# Patient Record
Sex: Female | Born: 1990 | Race: Black or African American | Hispanic: No | Marital: Single
Health system: Midwestern US, Community
[De-identification: ages and names within clinical notes are randomized; demographics above are authoritative.]

## PROBLEM LIST (undated history)

## (undated) ENCOUNTER — Inpatient Hospital Stay (HOSPITAL_COMMUNITY): Payer: Self-pay

## (undated) DIAGNOSIS — O9989 Other specified diseases and conditions complicating pregnancy, childbirth and the puerperium: Secondary | ICD-10-CM

## (undated) DIAGNOSIS — F419 Anxiety disorder, unspecified: Secondary | ICD-10-CM

## (undated) DIAGNOSIS — B009 Herpesviral infection, unspecified: Secondary | ICD-10-CM

## (undated) DIAGNOSIS — M549 Dorsalgia, unspecified: Secondary | ICD-10-CM

## (undated) DIAGNOSIS — O99891 Other specified diseases and conditions complicating pregnancy: Secondary | ICD-10-CM

## (undated) DIAGNOSIS — Z01419 Encounter for gynecological examination (general) (routine) without abnormal findings: Secondary | ICD-10-CM

## (undated) HISTORY — PX: TUBAL LIGATION: SHX77

## (undated) MED ORDER — ONDANSETRON 4 MG TAB, RAPID DISSOLVE
4 mg | ORAL_TABLET | Freq: Three times a day (TID) | ORAL | Status: DC | PRN
Start: ? — End: 2017-02-17

---
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
## 2009-04-17 LAB — STD PANEL
HIV 1/2 Interpretation: NONREACTIVE
Hep B surface Ag Interp.: NEGATIVE
Hepatitis B surface Ag: 0.11 Index (ref <1.00)
RPR: NONREACTIVE

## 2009-04-18 LAB — CHLAMYDIA/GC PCR
Chlamydia amplified: POSITIVE — AB
N. gonorrhea, amplified: NEGATIVE

## 2009-08-11 ENCOUNTER — Other Ambulatory Visit: Admission: RE | Admit: 2009-08-11 | Discharge: 2009-08-11 | Payer: Self-pay | Admitting: Family Medicine

## 2010-07-17 ENCOUNTER — Emergency Department (HOSPITAL_COMMUNITY)
Admission: EM | Admit: 2010-07-17 | Discharge: 2010-07-17 | Payer: Self-pay | Source: Home / Self Care | Admitting: Emergency Medicine

## 2010-08-11 ENCOUNTER — Other Ambulatory Visit: Admission: RE | Admit: 2010-08-11 | Discharge: 2010-08-11 | Payer: Self-pay | Admitting: Family Medicine

## 2010-10-27 LAB — GC/CHLAMYDIA PROBE AMP, GENITAL
Chlamydia: NEGATIVE
Gonorrhea: NEGATIVE

## 2010-10-27 LAB — ABO/RH

## 2010-10-27 LAB — RPR: RPR: NONREACTIVE

## 2010-12-08 LAB — CBC
HCT: 37 % (ref 36.0–46.0)
Hemoglobin: 12.7 g/dL (ref 12.0–15.0)
MCH: 28.2 pg (ref 26.0–34.0)
MCV: 82 fL (ref 78.0–100.0)
RBC: 4.51 MIL/uL (ref 3.87–5.11)

## 2010-12-08 LAB — DIFFERENTIAL
Basophils Relative: 0 % (ref 0–1)
Monocytes Absolute: 1 10*3/uL (ref 0.1–1.0)
Monocytes Relative: 7 % (ref 3–12)
Neutro Abs: 12.5 10*3/uL — ABNORMAL HIGH (ref 1.7–7.7)

## 2010-12-08 LAB — BASIC METABOLIC PANEL
CO2: 22 mEq/L (ref 19–32)
Chloride: 107 mEq/L (ref 96–112)
GFR calc Af Amer: >60 mL/min (ref >60)
Potassium: 3.5 mEq/L (ref 3.5–5.1)
Sodium: 137 mEq/L (ref 135–145)

## 2010-12-08 LAB — URINALYSIS, ROUTINE W REFLEX MICROSCOPIC
Bilirubin Urine: NEGATIVE
Glucose, UA: NEGATIVE mg/dL
Protein, ur: 100 mg/dL — AB
Specific Gravity, Urine: 1.015 (ref 1.005–1.030)

## 2010-12-08 LAB — URINE MICROSCOPIC-ADD ON

## 2010-12-08 LAB — URINE CULTURE

## 2010-12-26 ENCOUNTER — Inpatient Hospital Stay (HOSPITAL_COMMUNITY): Admission: AD | Admit: 2010-12-26 | Payer: Self-pay | Source: Ambulatory Visit | Admitting: Obstetrics

## 2011-01-11 ENCOUNTER — Other Ambulatory Visit (HOSPITAL_COMMUNITY): Payer: Self-pay | Admitting: Obstetrics

## 2011-01-11 DIAGNOSIS — Z3689 Encounter for other specified antenatal screening: Secondary | ICD-10-CM

## 2011-01-17 ENCOUNTER — Ambulatory Visit (HOSPITAL_COMMUNITY)
Admission: RE | Admit: 2011-01-17 | Discharge: 2011-01-17 | Disposition: A | Discharge status: Home / Self Care | Payer: Medicaid Other | Source: Ambulatory Visit | Attending: Obstetrics | Admitting: Obstetrics

## 2011-01-17 ENCOUNTER — Encounter (HOSPITAL_COMMUNITY): Payer: Self-pay

## 2011-01-17 DIAGNOSIS — Z3689 Encounter for other specified antenatal screening: Secondary | ICD-10-CM

## 2011-01-19 ENCOUNTER — Ambulatory Visit (HOSPITAL_COMMUNITY): Payer: Medicaid Other

## 2011-03-18 ENCOUNTER — Emergency Department (HOSPITAL_COMMUNITY)
Admission: EM | Admit: 2011-03-18 | Discharge: 2011-03-18 | Disposition: A | Discharge status: Home / Self Care | Payer: 59 | Attending: Emergency Medicine | Admitting: Emergency Medicine

## 2011-03-18 DIAGNOSIS — O99891 Other specified diseases and conditions complicating pregnancy: Secondary | ICD-10-CM | POA: Insufficient documentation

## 2011-03-18 DIAGNOSIS — M79609 Pain in unspecified limb: Secondary | ICD-10-CM | POA: Insufficient documentation

## 2011-04-01 LAB — STREP B DNA PROBE: GBS: POSITIVE

## 2011-05-18 ENCOUNTER — Encounter (HOSPITAL_COMMUNITY): Payer: Self-pay | Admitting: *Deleted

## 2011-05-18 ENCOUNTER — Telehealth (HOSPITAL_COMMUNITY): Payer: Self-pay | Admitting: *Deleted

## 2011-05-18 NOTE — Telephone Encounter (Signed)
Preadmission screen  

## 2011-05-20 ENCOUNTER — Inpatient Hospital Stay (HOSPITAL_COMMUNITY)
Admission: RE | Admit: 2011-05-20 | Discharge: 2011-05-25 | DRG: 765 | Disposition: A | Discharge status: Home / Self Care | Payer: 59 | Source: Ambulatory Visit | Attending: Obstetrics | Admitting: Obstetrics

## 2011-05-20 DIAGNOSIS — Z22330 Carrier of Group B streptococcus: Secondary | ICD-10-CM

## 2011-05-20 DIAGNOSIS — O99892 Other specified diseases and conditions complicating childbirth: Secondary | ICD-10-CM | POA: Diagnosis present

## 2011-05-20 LAB — CBC
Hemoglobin: 11.8 g/dL — ABNORMAL LOW (ref 12.0–15.0)
RBC: 4.41 MIL/uL (ref 3.87–5.11)

## 2011-05-20 LAB — RPR: RPR Ser Ql: NONREACTIVE

## 2011-05-20 MED ORDER — FENTANYL 2.5 MCG/ML BUPIVACAINE 1/10 % EPIDURAL INFUSION (WH - ANES)
14.0000 mL/h | INTRAMUSCULAR | Status: DC
  Administered 2011-05-21 – 2011-05-22 (×7): 14 mL/h via EPIDURAL
  Filled 2011-05-20 (×6): qty 60

## 2011-05-20 MED ORDER — MISOPROSTOL 25 MCG QUARTER TABLET
25.0000 ug | ORAL_TABLET | Freq: Four times a day (QID) | ORAL | Status: DC
  Administered 2011-05-20: 25 ug via VAGINAL
  Filled 2011-05-20: qty 0.25
  Filled 2011-05-20: qty 1

## 2011-05-20 MED ORDER — PHENYLEPHRINE 40 MCG/ML (10ML) SYRINGE FOR IV PUSH (FOR BLOOD PRESSURE SUPPORT)
80.0000 ug | PREFILLED_SYRINGE | INTRAVENOUS | Status: DC | PRN
  Filled 2011-05-20: qty 5

## 2011-05-20 MED ORDER — PENICILLIN G POTASSIUM 5000000 UNITS IJ SOLR
2.5000 10*6.[IU] | INTRAVENOUS | Status: DC
  Administered 2011-05-20 – 2011-05-22 (×10): 2.5 10*6.[IU] via INTRAVENOUS
  Filled 2011-05-20 (×13): qty 2.5

## 2011-05-20 MED ORDER — OXYCODONE-ACETAMINOPHEN 5-325 MG PO TABS
2.0000 | ORAL_TABLET | ORAL | Status: DC | PRN
  Administered 2011-05-23: 2 via ORAL
  Filled 2011-05-20 (×3): qty 2
  Filled 2011-05-20: qty 1
  Filled 2011-05-20: qty 2

## 2011-05-20 MED ORDER — OXYTOCIN 20 UNITS IN LACTATED RINGERS INFUSION - SIMPLE: 125.0000 mL/h | INTRAVENOUS | Status: AC

## 2011-05-20 MED ORDER — EPHEDRINE 5 MG/ML INJ
10.0000 mg | INTRAVENOUS | Status: AC | PRN
  Administered 2011-05-22: 10 mg via INTRAVENOUS
  Administered 2011-05-22: 5 mg via INTRAVENOUS
  Filled 2011-05-20: qty 4

## 2011-05-20 MED ORDER — PHENYLEPHRINE 40 MCG/ML (10ML) SYRINGE FOR IV PUSH (FOR BLOOD PRESSURE SUPPORT)
80.0000 ug | PREFILLED_SYRINGE | INTRAVENOUS | Status: DC | PRN
  Filled 2011-05-20 (×2): qty 5

## 2011-05-20 MED ORDER — PENICILLIN G POTASSIUM 5000000 UNITS IJ SOLR
5.0000 10*6.[IU] | Freq: Once | INTRAMUSCULAR | Status: AC
  Administered 2011-05-20: 5 10*6.[IU] via INTRAVENOUS
  Filled 2011-05-20: qty 5

## 2011-05-20 MED ORDER — FLEET ENEMA 7-19 GM/118ML RE ENEM: 1.0000 | ENEMA | RECTAL | Status: DC | PRN

## 2011-05-20 MED ORDER — OXYTOCIN BOLUS FROM INFUSION
500.0000 mL | Freq: Once | INTRAVENOUS | Status: DC
  Filled 2011-05-20: qty 1000
  Filled 2011-05-20: qty 500

## 2011-05-20 MED ORDER — ONDANSETRON HCL 4 MG/2ML IJ SOLN: 4.0000 mg | Freq: Four times a day (QID) | INTRAMUSCULAR | Status: DC | PRN

## 2011-05-20 MED ORDER — IBUPROFEN 600 MG PO TABS
600.0000 mg | ORAL_TABLET | Freq: Four times a day (QID) | ORAL | Status: DC | PRN
  Administered 2011-05-23: 600 mg via ORAL
  Filled 2011-05-20 (×4): qty 1

## 2011-05-20 MED ORDER — OXYTOCIN 20 UNITS IN LACTATED RINGERS INFUSION - SIMPLE
1.0000 m[IU]/min | INTRAVENOUS | Status: DC
  Administered 2011-05-20: 1 m[IU]/min via INTRAVENOUS
  Administered 2011-05-21: 5 m[IU]/min via INTRAVENOUS
  Administered 2011-05-21: 4 m[IU]/min via INTRAVENOUS
  Administered 2011-05-21: 3 m[IU]/min via INTRAVENOUS
  Administered 2011-05-21: 2 m[IU]/min via INTRAVENOUS
  Administered 2011-05-21: 7 m[IU]/min via INTRAVENOUS
  Administered 2011-05-21: 9 m[IU]/min via INTRAVENOUS
  Administered 2011-05-21: 8 m[IU]/min via INTRAVENOUS
  Administered 2011-05-21 (×2): 6 m[IU]/min via INTRAVENOUS
  Administered 2011-05-22: 20 [IU] via INTRAVENOUS
  Filled 2011-05-20: qty 1000

## 2011-05-20 MED ORDER — PROMETHAZINE HCL 25 MG/ML IJ SOLN
12.5000 mg | Freq: Four times a day (QID) | INTRAMUSCULAR | Status: DC | PRN
  Administered 2011-05-21: 12.5 mg via INTRAVENOUS
  Filled 2011-05-20: qty 1

## 2011-05-20 MED ORDER — CITRIC ACID-SODIUM CITRATE 334-500 MG/5ML PO SOLN
30.0000 mL | ORAL | Status: DC | PRN
  Administered 2011-05-22: 30 mL via ORAL
  Filled 2011-05-20 (×2): qty 15

## 2011-05-20 MED ORDER — OXYTOCIN 20 UNITS IN LACTATED RINGERS INFUSION - SIMPLE
1.0000 m[IU]/min | INTRAVENOUS | Status: DC
  Administered 2011-05-20: 2 m[IU]/min via INTRAVENOUS

## 2011-05-20 MED ORDER — TERBUTALINE SULFATE 1 MG/ML IJ SOLN
0.2500 mg | Freq: Once | INTRAMUSCULAR | Status: AC | PRN
  Filled 2011-05-20: qty 1

## 2011-05-20 MED ORDER — LACTATED RINGERS IV SOLN
500.0000 mL | INTRAVENOUS | Status: DC | PRN
  Administered 2011-05-21: 200 mL via INTRAVENOUS
  Administered 2011-05-22: 05:00:00 via INTRAVENOUS

## 2011-05-20 MED ORDER — LACTATED RINGERS IV SOLN
INTRAVENOUS | Status: DC
  Administered 2011-05-20 (×2): via INTRAVENOUS
  Administered 2011-05-21: 125 mL/h via INTRAVENOUS
  Administered 2011-05-21 – 2011-05-22 (×2): via INTRAVENOUS

## 2011-05-20 MED ORDER — EPHEDRINE 5 MG/ML INJ
10.0000 mg | INTRAVENOUS | Status: DC | PRN
  Filled 2011-05-20: qty 4

## 2011-05-20 MED ORDER — DIPHENHYDRAMINE HCL 50 MG/ML IJ SOLN
12.5000 mg | INTRAMUSCULAR | Status: DC | PRN
  Administered 2011-05-22: 12.5 mg via INTRAVENOUS
  Filled 2011-05-20: qty 1

## 2011-05-20 MED ORDER — LIDOCAINE HCL (PF) 1 % IJ SOLN
30.0000 mL | INTRAMUSCULAR | Status: DC | PRN
  Filled 2011-05-20: qty 30

## 2011-05-20 MED ORDER — ACETAMINOPHEN 325 MG PO TABS
650.0000 mg | ORAL_TABLET | ORAL | Status: DC | PRN
  Administered 2011-05-22: 650 mg via ORAL
  Filled 2011-05-20: qty 2

## 2011-05-20 MED ORDER — BUTORPHANOL TARTRATE 2 MG/ML IJ SOLN
2.0000 mg | INTRAMUSCULAR | Status: DC | PRN
  Administered 2011-05-21: 2 mg via INTRAVENOUS
  Filled 2011-05-20: qty 1

## 2011-05-20 MED ORDER — LACTATED RINGERS IV SOLN
500.0000 mL | Freq: Once | INTRAVENOUS | Status: AC
  Administered 2011-05-21: 500 mL via INTRAVENOUS

## 2011-05-20 NOTE — H&P (Signed)
This is Dr. Francoise Ceo dictating the history and physical on  Joanne Ellis she's a 20 year old gravida 1 EDC 05/14/2011 desires induction she has a positive GBS was treated with penicillin she is allergic to latex She was not contracting and her cervix is tight 1 cm 70% vertex -3 station Amniotomy was deferred and Cytotec inserted 250 Mics in the vagina to the mouth her cervix Past medical history negative Past surgical history negative Family history negative System review negative Physical exam HEENT negative Lungs clear Heart regular rhythm no murmurs no gallops Breasts negative Abdomen term estimated fetal weight 6 lbs. 8 oz. Pelvic as described above Extremities negative

## 2011-05-21 ENCOUNTER — Encounter (HOSPITAL_COMMUNITY): Payer: Self-pay

## 2011-05-21 LAB — CBC
Hemoglobin: 12.4 g/dL (ref 12.0–15.0)
MCH: 26.9 pg (ref 26.0–34.0)
MCHC: 33.9 g/dL (ref 30.0–36.0)
Platelets: 180 10*3/uL (ref 150–400)
RDW: 14 % (ref 11.5–15.5)

## 2011-05-21 MED ORDER — TERBUTALINE SULFATE 1 MG/ML IJ SOLN
0.2500 mg | Freq: Once | INTRAMUSCULAR | Status: AC
  Administered 2011-05-21: 0.25 mg via SUBCUTANEOUS

## 2011-05-21 MED ORDER — CEFAZOLIN SODIUM 1 G IJ SOLR: 1.0000 g | Freq: Three times a day (TID) | INTRAMUSCULAR | Status: DC

## 2011-05-21 MED ORDER — LACTATED RINGERS IV SOLN
INTRAVENOUS | Status: DC
  Administered 2011-05-21: 150 mL/h via INTRAUTERINE
  Administered 2011-05-21: 300 mL via INTRAUTERINE

## 2011-05-21 MED ORDER — BUPIVACAINE HCL (PF) 0.25 % IJ SOLN
INTRAMUSCULAR | Status: DC | PRN
  Administered 2011-05-21: 5 mL via EPIDURAL

## 2011-05-21 MED ORDER — LIDOCAINE HCL 1.5 % IJ SOLN
INTRAMUSCULAR | Status: DC | PRN
  Administered 2011-05-21 (×2): 5 mL via EPIDURAL
  Administered 2011-05-21: 2 mL via EPIDURAL

## 2011-05-21 NOTE — Progress Notes (Signed)
Dr Gaynell Face called.  Notified to MD that even though pt is calmer now.  Pt requesting a c/section.  Notified MD of fhr and uc's

## 2011-05-21 NOTE — Progress Notes (Signed)
Oxygen removed.   Updated family members on care of pt (oxygen, amnioinfusion, pitocin)

## 2011-05-21 NOTE — Progress Notes (Signed)
Pt hyperventilating.  Pain escalated .  Abd does not feel like it is relaxing between uc's.  Pitocin and Amnioinfusion d/c'd

## 2011-05-21 NOTE — Progress Notes (Signed)
Dr Gaynell Face updated on phone,  Informed OB of sve, fhr , uc's, and Pitocin dose.  MD stated to leave Pitocin at present dose

## 2011-05-21 NOTE — Progress Notes (Signed)
  Patient is still 1 cm 80% vertex -1 her membranes ruptured  spontaneously at 4 AM this morning Patient is to have epidural then IUPC

## 2011-05-21 NOTE — Progress Notes (Signed)
Family at bedside giving emotional support

## 2011-05-21 NOTE — Progress Notes (Signed)
Pt c/o constant pain.  Pt turned towards back.  Refuses to  Be examined

## 2011-05-21 NOTE — Progress Notes (Signed)
Dr Gaynell Face in room

## 2011-05-21 NOTE — Anesthesia Procedure Notes (Signed)
Epidural Patient location during procedure: OB Start time: 05/21/2011 7:33 AM Reason for block: procedure for pain  Staffing Performed by: anesthesiologist   Preanesthetic Checklist Completed: patient identified, site marked, surgical consent, pre-op evaluation, timeout performed, IV checked, risks and benefits discussed and monitors and equipment checked  Epidural Patient position: sitting Prep: site prepped and draped and DuraPrep Patient monitoring: continuous pulse ox and blood pressure Approach: midline Injection technique: LOR air  Needle:  Needle type: Tuohy  Needle gauge: 17 G Needle length: 9 cm Needle insertion depth: 6 cm Catheter type: closed end flexible Catheter size: 19 Gauge Catheter at skin depth: 11 cm Test dose: negative  Assessment Events: blood not aspirated, injection not painful, no injection resistance, negative IV test and no paresthesia  Additional Notes Discussed risk of headache, infection, bleeding, nerve injury and failed or incomplete block.  Patient voices understanding and wishes to proceed.

## 2011-05-21 NOTE — Progress Notes (Signed)
Amnioinfusion with Lactated Ringers started (300 cc).  Pt educated on reason for amnioinfusion

## 2011-05-21 NOTE — Progress Notes (Signed)
Oxygen removed

## 2011-05-21 NOTE — Progress Notes (Signed)
  Cervix 2 cm 85% vertex -2 station IUPC inserted she's now on 9 milliunits of Pitocin Reactive tracing and

## 2011-05-21 NOTE — Anesthesia Preprocedure Evaluation (Signed)
Anesthesia Evaluation  Name, MR# and DOB Patient awake  General Assessment Comment  Reviewed: Allergy & Precautions, H&P , NPO status , Patient's Chart, lab work & pertinent test results, reviewed documented beta blocker date and time   History of Anesthesia Complications Negative for: history of anesthetic complications  Airway Mallampati: I TM Distance: >3 FB Neck ROM: full    Dental  (+) Teeth Intact   Pulmonary  clear to auscultation  breath sounds clear to auscultation none    Cardiovascular regular Normal    Neuro/Psych Negative Neurological ROS  Negative Psych ROS  GI/Hepatic/Renal negative GI ROS  negative Liver ROS  negative Renal ROS        Endo/Other  Negative Endocrine ROS (+)      Abdominal   Musculoskeletal   Hematology negative hematology ROS (+)   Peds  Reproductive/Obstetrics (+) Pregnancy    Anesthesia Other Findings             Anesthesia Physical Anesthesia Plan  ASA: II  Anesthesia Plan: Epidural   Post-op Pain Management:    Induction:   Airway Management Planned:   Additional Equipment:   Intra-op Plan:   Post-operative Plan:   Informed Consent: I have reviewed the patients History and Physical, chart, labs and discussed the procedure including the risks, benefits and alternatives for the proposed anesthesia with the patient or authorized representative who has indicated his/her understanding and acceptance.     Plan Discussed with:   Anesthesia Plan Comments:         Anesthesia Quick Evaluation  

## 2011-05-22 ENCOUNTER — Encounter (HOSPITAL_COMMUNITY): Payer: Self-pay | Admitting: Anesthesiology

## 2011-05-22 ENCOUNTER — Other Ambulatory Visit: Payer: Self-pay | Admitting: Obstetrics

## 2011-05-22 ENCOUNTER — Encounter (HOSPITAL_COMMUNITY)
Admission: RE | Disposition: A | Discharge status: Home / Self Care | Payer: Self-pay | Source: Ambulatory Visit | Attending: Obstetrics

## 2011-05-22 ENCOUNTER — Inpatient Hospital Stay (HOSPITAL_COMMUNITY): Payer: 59 | Admitting: Anesthesiology

## 2011-05-22 ENCOUNTER — Encounter (HOSPITAL_COMMUNITY): Payer: Self-pay

## 2011-05-22 SURGERY — Surgical Case
Anesthesia: Regional

## 2011-05-22 MED ORDER — CEFAZOLIN SODIUM 1 G IJ SOLR: 1.0000 g | Freq: Three times a day (TID) | INTRAMUSCULAR | Status: DC

## 2011-05-22 MED ORDER — OXYCODONE-ACETAMINOPHEN 5-325 MG PO TABS
1.0000 | ORAL_TABLET | ORAL | Status: DC | PRN
  Administered 2011-05-24 – 2011-05-25 (×2): 1 via ORAL
  Filled 2011-05-22: qty 1

## 2011-05-22 MED ORDER — METOCLOPRAMIDE HCL 5 MG/ML IJ SOLN: 10.0000 mg | Freq: Three times a day (TID) | INTRAMUSCULAR | Status: DC | PRN

## 2011-05-22 MED ORDER — ONDANSETRON HCL 4 MG/2ML IJ SOLN
INTRAMUSCULAR | Status: DC | PRN
  Administered 2011-05-22: 4 mg via INTRAVENOUS

## 2011-05-22 MED ORDER — SIMETHICONE 80 MG PO CHEW
80.0000 mg | CHEWABLE_TABLET | Freq: Three times a day (TID) | ORAL | Status: DC
  Administered 2011-05-22 – 2011-05-25 (×7): 80 mg via ORAL

## 2011-05-22 MED ORDER — LIDOCAINE-EPINEPHRINE (PF) 2 %-1:200000 IJ SOLN
INTRAMUSCULAR | Status: AC
  Filled 2011-05-22: qty 20

## 2011-05-22 MED ORDER — TETANUS-DIPHTH-ACELL PERTUSSIS 5-2.5-18.5 LF-MCG/0.5 IM SUSP
0.5000 mL | Freq: Once | INTRAMUSCULAR | Status: AC
Start: 2011-05-23 — End: 2011-05-23
  Administered 2011-05-23: 0.5 mL via INTRAMUSCULAR

## 2011-05-22 MED ORDER — DIBUCAINE 1 % RE OINT: 1.0000 "application " | TOPICAL_OINTMENT | RECTAL | Status: DC | PRN

## 2011-05-22 MED ORDER — OXYTOCIN 20 UNITS IN LACTATED RINGERS INFUSION - SIMPLE
125.0000 mL/h | INTRAVENOUS | Status: AC
  Administered 2011-05-22: 125 mL/h via INTRAVENOUS

## 2011-05-22 MED ORDER — ONDANSETRON HCL 4 MG/2ML IJ SOLN: 4.0000 mg | INTRAMUSCULAR | Status: DC | PRN

## 2011-05-22 MED ORDER — SCOPOLAMINE 1 MG/3DAYS TD PT72
1.0000 | MEDICATED_PATCH | Freq: Once | TRANSDERMAL | Status: AC
  Administered 2011-05-22: 1.5 mg via TRANSDERMAL

## 2011-05-22 MED ORDER — MORPHINE SULFATE (PF) 0.5 MG/ML IJ SOLN
INTRAMUSCULAR | Status: DC | PRN
  Administered 2011-05-22: 4 mg via EPIDURAL
  Administered 2011-05-22: 1 mg via INTRAVENOUS

## 2011-05-22 MED ORDER — MENTHOL 3 MG MT LOZG: 1.0000 | LOZENGE | OROMUCOSAL | Status: DC | PRN

## 2011-05-22 MED ORDER — IBUPROFEN 600 MG PO TABS: 600.0000 mg | ORAL_TABLET | Freq: Four times a day (QID) | ORAL | Status: DC | PRN

## 2011-05-22 MED ORDER — SENNOSIDES-DOCUSATE SODIUM 8.6-50 MG PO TABS
2.0000 | ORAL_TABLET | Freq: Every day | ORAL | Status: DC
  Administered 2011-05-22 – 2011-05-24 (×3): 2 via ORAL

## 2011-05-22 MED ORDER — SODIUM BICARBONATE 8.4 % IV SOLN
INTRAVENOUS | Status: AC
  Filled 2011-05-22: qty 50

## 2011-05-22 MED ORDER — ONDANSETRON HCL 4 MG/2ML IJ SOLN
INTRAMUSCULAR | Status: AC
  Filled 2011-05-22: qty 2

## 2011-05-22 MED ORDER — CEFAZOLIN SODIUM 1-5 GM-% IV SOLN
INTRAVENOUS | Status: DC | PRN
  Administered 2011-05-22: 1 g via INTRAVENOUS

## 2011-05-22 MED ORDER — SCOPOLAMINE 1 MG/3DAYS TD PT72
MEDICATED_PATCH | TRANSDERMAL | Status: AC
  Administered 2011-05-22: 1.5 mg via TRANSDERMAL
  Filled 2011-05-22: qty 1

## 2011-05-22 MED ORDER — DIPHENHYDRAMINE HCL 25 MG PO CAPS: 25.0000 mg | ORAL_CAPSULE | Freq: Four times a day (QID) | ORAL | Status: DC | PRN

## 2011-05-22 MED ORDER — SIMETHICONE 80 MG PO CHEW: 80.0000 mg | CHEWABLE_TABLET | ORAL | Status: DC | PRN

## 2011-05-22 MED ORDER — ONDANSETRON HCL 4 MG/2ML IJ SOLN: 4.0000 mg | Freq: Three times a day (TID) | INTRAMUSCULAR | Status: DC | PRN

## 2011-05-22 MED ORDER — LIDOCAINE-EPINEPHRINE (PF) 2 %-1:200000 IJ SOLN
INTRAMUSCULAR | Status: DC | PRN
  Administered 2011-05-22: 3 mL via EPIDURAL

## 2011-05-22 MED ORDER — CEFAZOLIN SODIUM 1-5 GM-% IV SOLN
1.0000 g | Freq: Three times a day (TID) | INTRAVENOUS | Status: AC
  Administered 2011-05-22 – 2011-05-23 (×3): 1 g via INTRAVENOUS
  Filled 2011-05-22 (×3): qty 50

## 2011-05-22 MED ORDER — EPHEDRINE 5 MG/ML INJ
INTRAVENOUS | Status: AC
  Filled 2011-05-22: qty 10

## 2011-05-22 MED ORDER — DIPHENHYDRAMINE HCL 25 MG PO CAPS: 25.0000 mg | ORAL_CAPSULE | ORAL | Status: DC | PRN

## 2011-05-22 MED ORDER — ZOLPIDEM TARTRATE 5 MG PO TABS: 5.0000 mg | ORAL_TABLET | Freq: Every evening | ORAL | Status: DC | PRN

## 2011-05-22 MED ORDER — HYDROMORPHONE HCL 1 MG/ML IJ SOLN: 0.2500 mg | INTRAMUSCULAR | Status: DC | PRN

## 2011-05-22 MED ORDER — ONDANSETRON HCL 4 MG PO TABS: 4.0000 mg | ORAL_TABLET | ORAL | Status: DC | PRN

## 2011-05-22 MED ORDER — MORPHINE SULFATE 0.5 MG/ML IJ SOLN
INTRAMUSCULAR | Status: AC
  Filled 2011-05-22: qty 10

## 2011-05-22 MED ORDER — OXYTOCIN 20 UNITS IN LACTATED RINGERS INFUSION - SIMPLE
INTRAVENOUS | Status: AC
  Filled 2011-05-22: qty 1000

## 2011-05-22 MED ORDER — PRENATAL PLUS 27-1 MG PO TABS
1.0000 | ORAL_TABLET | Freq: Every day | ORAL | Status: DC
  Administered 2011-05-23 – 2011-05-25 (×3): 1 via ORAL
  Filled 2011-05-22 (×3): qty 1

## 2011-05-22 MED ORDER — CEFAZOLIN SODIUM 1-5 GM-% IV SOLN
INTRAVENOUS | Status: AC
  Filled 2011-05-22: qty 50

## 2011-05-22 MED ORDER — LACTATED RINGERS IV SOLN
INTRAVENOUS | Status: DC
  Administered 2011-05-22: 12:00:00 via INTRAVENOUS

## 2011-05-22 MED ORDER — KETOROLAC TROMETHAMINE 60 MG/2ML IM SOLN
INTRAMUSCULAR | Status: AC
  Administered 2011-05-22: 60 mg via INTRAMUSCULAR
  Filled 2011-05-22: qty 2

## 2011-05-22 MED ORDER — NALBUPHINE HCL 10 MG/ML IJ SOLN
5.0000 mg | INTRAMUSCULAR | Status: DC | PRN
  Administered 2011-05-22: 10 mg via INTRAVENOUS
  Filled 2011-05-22 (×3): qty 1

## 2011-05-22 MED ORDER — KETOROLAC TROMETHAMINE 30 MG/ML IJ SOLN: 30.0000 mg | Freq: Four times a day (QID) | INTRAMUSCULAR | Status: AC | PRN

## 2011-05-22 MED ORDER — NALOXONE HCL 0.4 MG/ML IJ SOLN: 0.4000 mg | INTRAMUSCULAR | Status: DC | PRN

## 2011-05-22 MED ORDER — LANOLIN HYDROUS EX OINT: 1.0000 "application " | TOPICAL_OINTMENT | CUTANEOUS | Status: DC | PRN

## 2011-05-22 MED ORDER — SODIUM CHLORIDE 0.9 % IV SOLN: 1.0000 ug/kg/h | INTRAVENOUS | Status: DC | PRN

## 2011-05-22 MED ORDER — OXYTOCIN 10 UNIT/ML IJ SOLN
INTRAMUSCULAR | Status: AC
  Filled 2011-05-22: qty 4

## 2011-05-22 MED ORDER — KETOROLAC TROMETHAMINE 60 MG/2ML IM SOLN
60.0000 mg | Freq: Once | INTRAMUSCULAR | Status: AC | PRN
  Administered 2011-05-22: 60 mg via INTRAMUSCULAR

## 2011-05-22 MED ORDER — DIPHENHYDRAMINE HCL 50 MG/ML IJ SOLN: 12.5000 mg | INTRAMUSCULAR | Status: DC | PRN

## 2011-05-22 MED ORDER — WITCH HAZEL-GLYCERIN EX PADS: 1.0000 "application " | MEDICATED_PAD | CUTANEOUS | Status: DC | PRN

## 2011-05-22 MED ORDER — NALBUPHINE HCL 10 MG/ML IJ SOLN
5.0000 mg | INTRAMUSCULAR | Status: DC | PRN
  Administered 2011-05-22: 10 mg via SUBCUTANEOUS
  Filled 2011-05-22: qty 1

## 2011-05-22 MED ORDER — IBUPROFEN 600 MG PO TABS
600.0000 mg | ORAL_TABLET | Freq: Four times a day (QID) | ORAL | Status: DC
  Administered 2011-05-22 – 2011-05-25 (×10): 600 mg via ORAL
  Filled 2011-05-22 (×8): qty 1

## 2011-05-22 MED ORDER — SODIUM CHLORIDE 0.9 % IJ SOLN: 3.0000 mL | INTRAMUSCULAR | Status: DC | PRN

## 2011-05-22 MED ORDER — DIPHENHYDRAMINE HCL 50 MG/ML IJ SOLN: 25.0000 mg | INTRAMUSCULAR | Status: DC | PRN

## 2011-05-22 MED ORDER — MEPERIDINE HCL 25 MG/ML IJ SOLN: 6.2500 mg | INTRAMUSCULAR | Status: DC | PRN

## 2011-05-22 SURGICAL SUPPLY — 29 items
CLOTH BEACON ORANGE TIMEOUT ST (SAFETY) ×2 IMPLANT
CONTAINER PREFILL 10% NBF 15ML (MISCELLANEOUS) ×4 IMPLANT
DERMABOND ADVANCED (GAUZE/BANDAGES/DRESSINGS) ×2 IMPLANT
DRESSING TELFA 8X3 (GAUZE/BANDAGES/DRESSINGS) IMPLANT
ELECT REM PT RETURN 9FT ADLT (ELECTROSURGICAL) ×2
ELECTRODE REM PT RTRN 9FT ADLT (ELECTROSURGICAL) ×1 IMPLANT
EXTRACTOR VACUUM M CUP 4 TUBE (SUCTIONS) IMPLANT
GAUZE SPONGE 4X4 12PLY STRL LF (GAUZE/BANDAGES/DRESSINGS) ×4 IMPLANT
GLOVE BIO SURGEON STRL SZ8.5 (GLOVE) ×4 IMPLANT
GOWN PREVENTION PLUS LG XLONG (DISPOSABLE) ×4 IMPLANT
GOWN PREVENTION PLUS XXLARGE (GOWN DISPOSABLE) ×2 IMPLANT
KIT ABG SYR 3ML LUER SLIP (SYRINGE) IMPLANT
NEEDLE HYPO 25X5/8 SAFETYGLIDE (NEEDLE) ×2 IMPLANT
NS IRRIG 1000ML POUR BTL (IV SOLUTION) ×2 IMPLANT
PACK C SECTION WH (CUSTOM PROCEDURE TRAY) ×2 IMPLANT
PAD ABD 7.5X8 STRL (GAUZE/BANDAGES/DRESSINGS) IMPLANT
SLEEVE SCD COMPRESS KNEE MED (MISCELLANEOUS) IMPLANT
SUT CHROMIC 0 CT 802H (SUTURE) ×2 IMPLANT
SUT CHROMIC 1 CTX 36 (SUTURE) ×6 IMPLANT
SUT CHROMIC 2 0 SH (SUTURE) ×2 IMPLANT
SUT GUT PLAIN 0 CT-3 TAN 27 (SUTURE) ×2 IMPLANT
SUT MON AB 4-0 PS1 27 (SUTURE) ×2 IMPLANT
SUT VIC AB 0 CT1 18XCR BRD8 (SUTURE) IMPLANT
SUT VIC AB 0 CT1 8-18 (SUTURE)
SUT VIC AB 0 CTX 36 (SUTURE) ×2
SUT VIC AB 0 CTX36XBRD ANBCTRL (SUTURE) ×2 IMPLANT
TOWEL OR 17X24 6PK STRL BLUE (TOWEL DISPOSABLE) ×4 IMPLANT
TRAY FOLEY CATH 14FR (SET/KITS/TRAYS/PACK) ×2 IMPLANT
WATER STERILE IRR 1000ML POUR (IV SOLUTION) ×2 IMPLANT

## 2011-05-22 NOTE — Anesthesia Postprocedure Evaluation (Signed)
Anesthesia Post Note  Patient: Joanne Ellis  Procedure(s) Performed:  CESAREAN SECTION  Anesthesia type: Epidural  Patient location: PACU  Post pain: Pain level controlled  Post assessment: Post-op Vital signs reviewed  Last Vitals:  Filed Vitals:   05/22/11 0700  BP: 113/56  Pulse: 79  Temp: 98.3 F (36.8 C)  Resp: 18    Post vital signs: Reviewed  Level of consciousness: awake  Complications: No apparent anesthesia complications

## 2011-05-22 NOTE — Op Note (Signed)
This is Dr. Francoise Ceo dictating the operative note on Joanne Ellis  preop diagnosis maternal fever failure to progress in labor Postop diagnosis the same Anesthesia epidural procedure patient placed on the operating table in the supine position after the epidural dosed Abdomen prepped and draped bladder emptied with Foley catheter A transverse suprapubic incision made carried down to the rectus fascia The fascia cleaned and incised the length of the incision The recti muscles retracted laterally and the peritoneum incised longitudinally Transverse incision made in the visceroperitoneum above the bladder bladder mobilized inferiorly Transverse low uterine incision made in the patient delivered from the OP position of a female Apgar 99 weighing 6 lbs. 4 oz. The team was in 6 the team was in attendance the fluid was clear the placenta was anterior removed manually and sent to pathology Uterus cleaned and cleaned with a moist lap the uterine incision closed in one layer with continuous within normal on chromic hemostasis was satisfactory Bladder flap reattached to a chromic Lap and sponge counts correct Abdomen closed in layers peritoneum closed with continuous within of 0 chromic fascia continuous suture dictums 0 Dexon and the skin shows a subcuticular subcuticular stitch of  4-0 Monocryl blood loss 1000 cc patient tolerated the procedure well taken to recovery room in good condition

## 2011-05-22 NOTE — Progress Notes (Signed)
  Cervix remains unchanged at 9 cm 0 station with molding and the patient has been in good labor She'll be delivered by C-section for failure to progress in labor Temp 100 01+ she got Tylenol and she's been on R. penicillin for positive GBS

## 2011-05-22 NOTE — Anesthesia Postprocedure Evaluation (Signed)
  Anesthesia Post-op Note  Patient: Joanne Ellis  Procedure(s) Performed:  CESAREAN SECTION  Patient Location: PACU and Mother/Baby  Anesthesia Type: Epidural  Level of Consciousness: awake, alert  and oriented  Airway and Oxygen Therapy: Patient Spontanous Breathing  Post-op Pain: mild  Post-op Assessment: Patient's Cardiovascular Status Stable, Respiratory Function Stable, Patent Airway, No signs of Nausea or vomiting and Pain level controlled  Post-op Vital Signs: stable  Complications: No apparent anesthesia complications

## 2011-05-22 NOTE — Progress Notes (Signed)
Joanne Ellis has progressed slowly at 5 PM she was 4-5 cm 90% vertex -1 station At that time she had an IUPC  w hich she complained  About and it was removed Anesthesia   was able to dose her epidural and she became comfortable and Pitocin was resumed   11 PM she was 7 cm dilated and 0 station Now she is 9 cm 0 station fetal tracing is reactive she has no  decels on the mechanical consistent basis and

## 2011-05-22 NOTE — Consult Note (Signed)
Neonatology Note:   Attendance at C-section:    I was asked to attend this primary C/S at 41 weeks due to [facility]. The mother is a G1P0 B pos, GBS positive with ROM 24 hours PTD.  She received Pen G for almost 24 hours PTD and had a maximum temp of 101.4 degrees during labor. At delivery, fluid clear. Infant vigorous with good spontaneous cry and tone. Needed only  bulb suctioning. Ap 9/9. Lungs clear to ausc in DR. To CN to care of Pediatrician.   Deatra James, MD

## 2011-05-22 NOTE — Transfer of Care (Signed)
  Anesthesia Post-op Note  Patient: Joanne Ellis  Procedure(s) Performed:  CESAREAN SECTION  Patient Location: PACU  Anesthesia Type: Epidural  Level of Consciousness: awake, alert  and oriented  Airway and Oxygen Therapy: Patient Spontanous Breathing  Post-op Pain: none  Post-op Assessment: Post-op Vital signs reviewed, Patient's Cardiovascular Status Stable, Respiratory Function Stable, Patent Airway, No signs of Nausea or vomiting and Pain level controlled  Post-op Vital Signs: stable  Complications: No apparent anesthesia complications

## 2011-05-23 LAB — CBC
MCH: 27 pg (ref 26.0–34.0)
MCV: 79.9 fL (ref 78.0–100.0)
Platelets: 135 10*3/uL — ABNORMAL LOW (ref 150–400)
RBC: 3.19 MIL/uL — ABNORMAL LOW (ref 3.87–5.11)
RDW: 14.5 % (ref 11.5–15.5)

## 2011-05-23 MED ORDER — BISACODYL 10 MG RE SUPP: 10.0000 mg | Freq: Every day | RECTAL | Status: DC | PRN

## 2011-05-23 NOTE — Progress Notes (Signed)
  Postop day 1 Vital signs normal Fundus firm Incision clean and dry Lochia moderate No complaints

## 2011-05-24 NOTE — Progress Notes (Signed)
  Postoperative day #2 Vital signs normal fundus firm Lochia moderate  Legs negative No complaints

## 2011-05-25 NOTE — Progress Notes (Signed)
  In a you and you postoperative day #3 Vital signs normal Fundus firm Incision clean Legs negative Discharge today no complaints

## 2011-05-25 NOTE — Discharge Summary (Signed)
Obstetric Discharge Summary Reason for Admission: onset of labor Prenatal Procedures: none Intrapartum Procedures: cesarean: low cervical, transverse Postpartum Procedures: none Complications-Operative and Postpartum: none Hemoglobin  Date Value Range Status  05/23/2011 8.6* 12.0-15.0 (g/dL) Final     DELTA CHECK NOTED     REPEATED TO VERIFY     HCT  Date Value Range Status  05/23/2011 25.5* 36.0-46.0 (%) Final    Discharge Diagnoses: Term Pregnancy-delivered  Discharge Information: Date: 05/25/2011 Activity: pelvic rest Diet: routine Medications: Percocet Condition: stable Instructions: refer to practice specific booklet Discharge to: home Follow-up Information    Follow up with MARSHALL,BERNARD A, MD. Call in 6 weeks.   Contact information:   [address] [place] [address] [phone]          Newborn Data: Live born female  Birth Weight: 6 lb 4.5 oz (2850 g) APGAR: 9, 9  Home with mother.  MARSHALL,BERNARD A 05/25/2011, 6:54 AM

## 2011-06-07 ENCOUNTER — Encounter (HOSPITAL_COMMUNITY): Payer: Self-pay | Admitting: Obstetrics

## 2012-02-19 IMAGING — US US OB COMP +14 WK
2 series · 12 of 28 positions shown · non-contrast
Comparison: none

[Series 1: us ob comp +14 wk · 1 of 4 slices shown (1 of 2)]
[im 4/4]
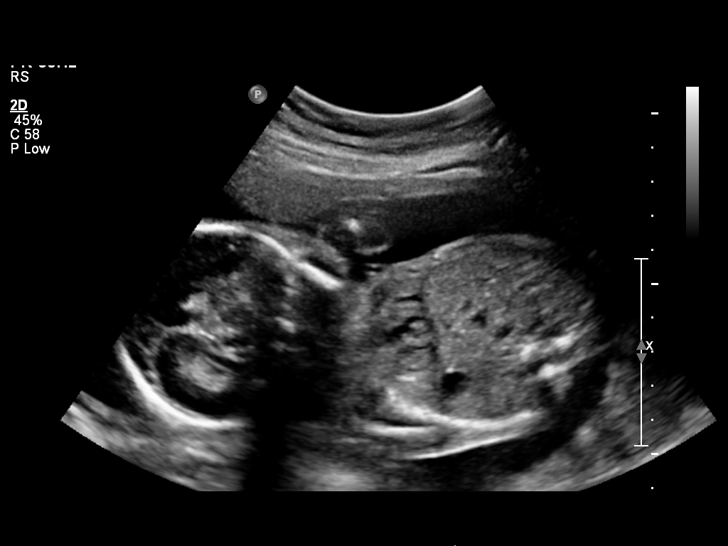

[Series 1: us ob comp +14 wk · 11 of 68 slices shown (2 of 2)]
[im 3/68]
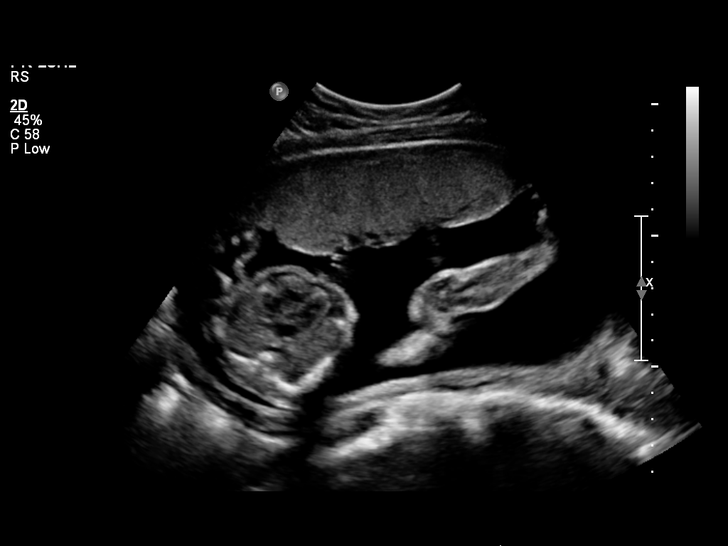
[im 9/68]
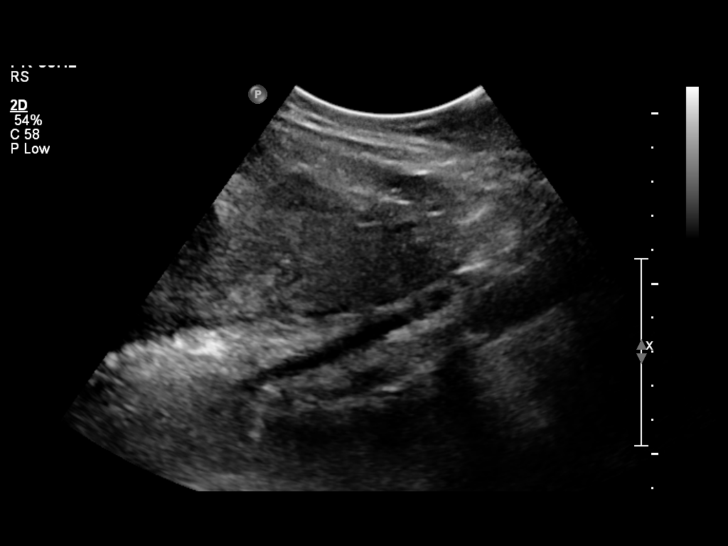
[im 17/68]
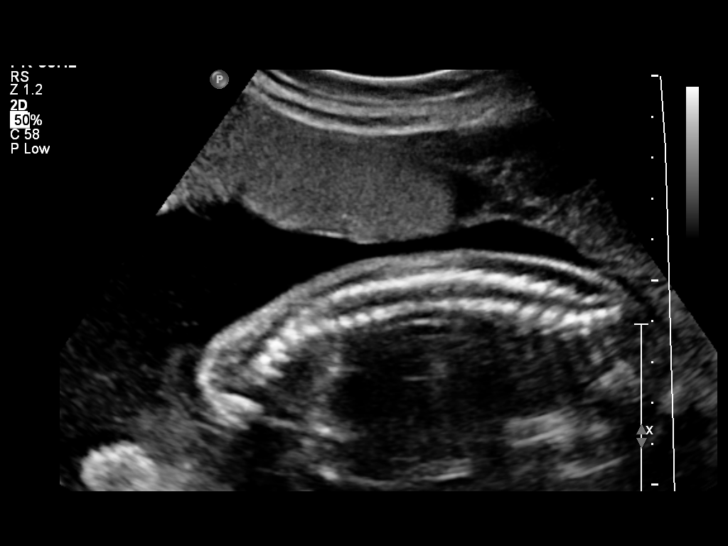
[im 22/68]
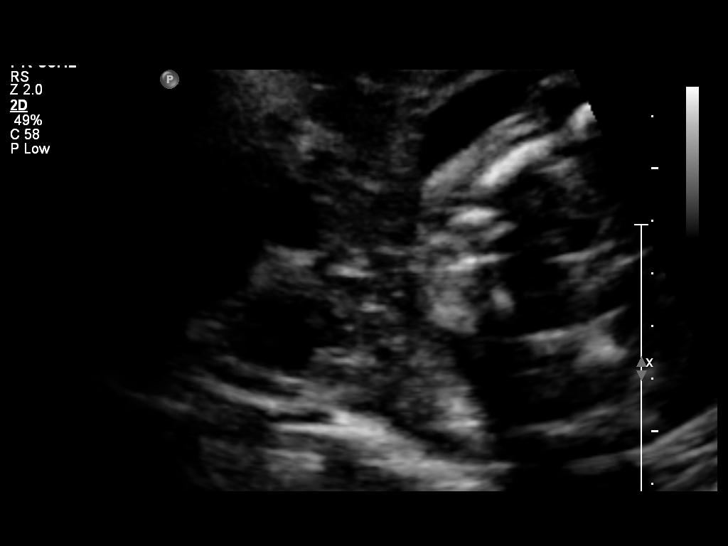
[im 27/68]
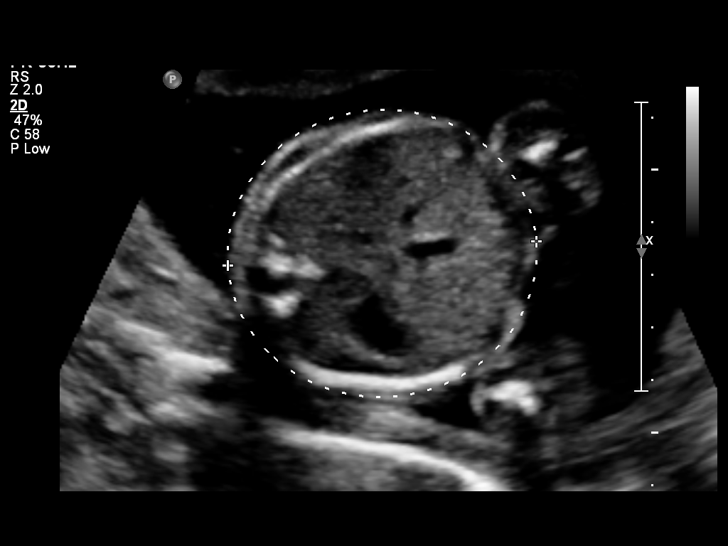
[im 35/68]
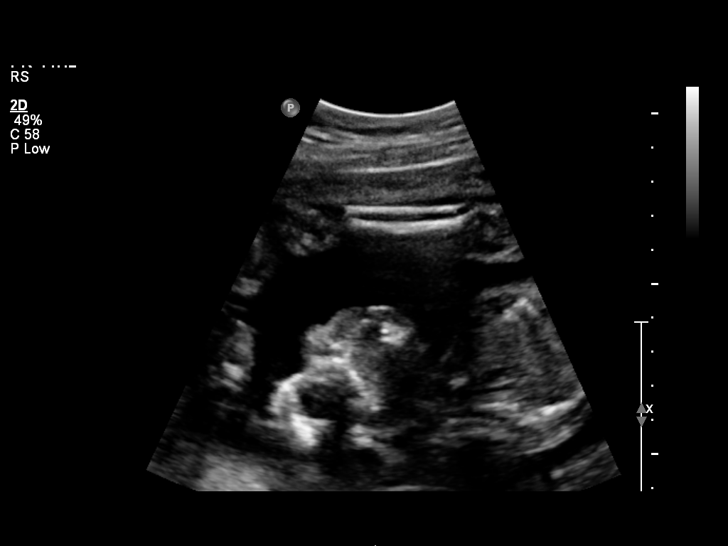
[im 41/68]
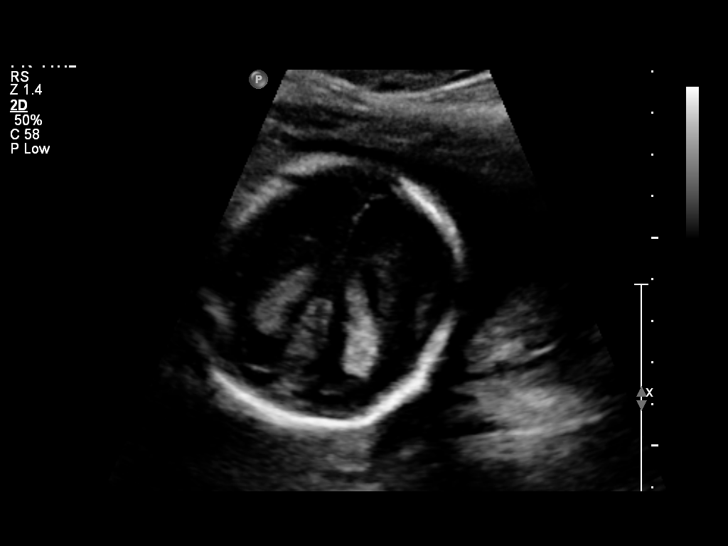
[im 46/68]
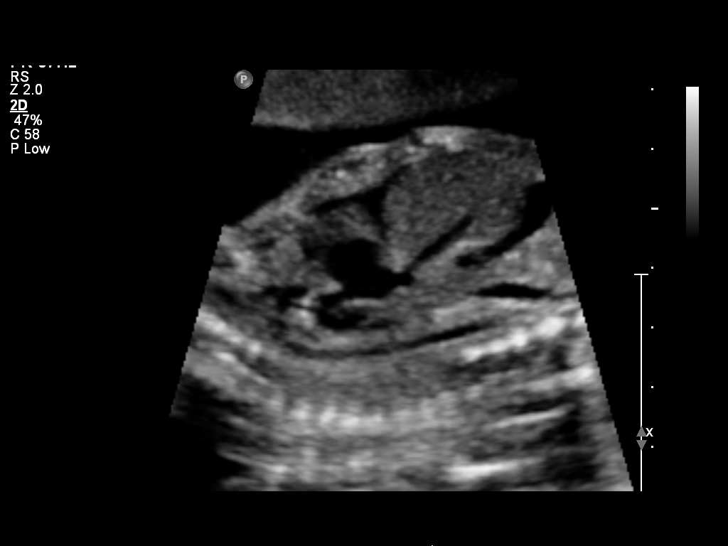
[im 54/68]
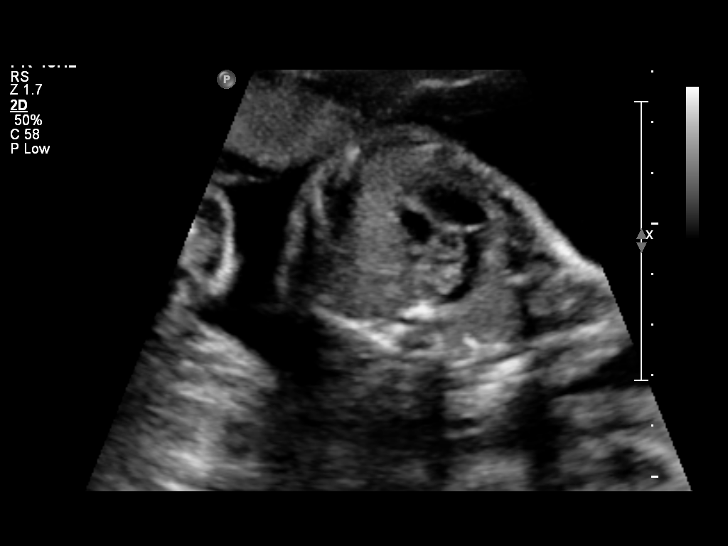
[im 59/68]
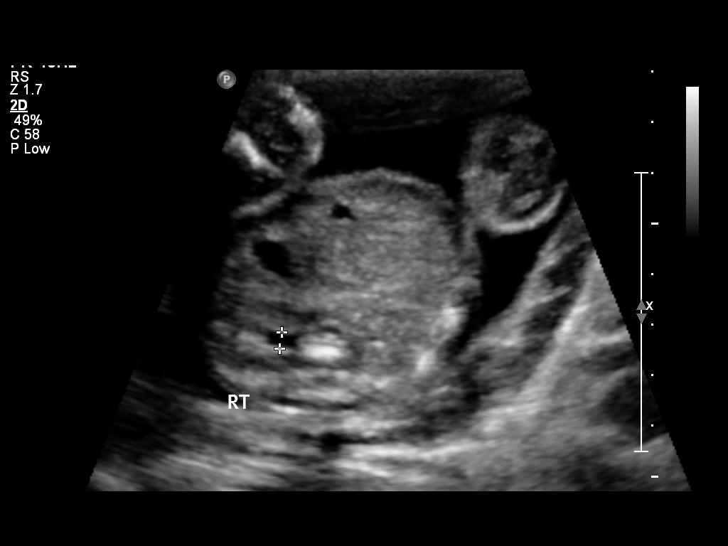
[im 65/68]
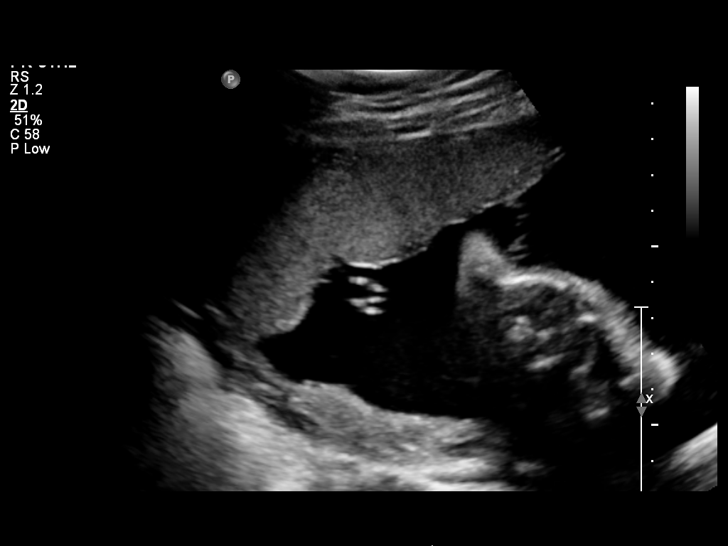

[12 of 28 positions shown; findings below may reference images not displayed]

OBSTETRICS REPORT
                      (Signed Final 01/17/2011 [DATE])

 Order#:         [id number]
Procedures

 US OB COMP + 14 WK                                    76805.1
Indications

 Routine fetal anatomic survey
Fetal Evaluation

 Fetal Heart Rate:  153                          bpm
 Cardiac Activity:  Observed
 Presentation:      Breech
 Placenta:          Anterior, above cervical os
 P. Cord            Visualized
 Insertion:

 Amniotic Fluid
 AFI FV:      Subjectively within normal limits
                                             Larg Pckt:     7.6  cm
Biometry

 BPD:     55.5  mm     G. Age:  23w 0d                CI:        68.36   70 - 86
                                                      FL/HC:      19.1   19.2 -

 HC:     214.6  mm     G. Age:  23w 4d       44  %    HC/AC:      1.19   1.05 -

 AC:     180.4  mm     G. Age:  22w 6d       29  %    FL/BPD:     73.9   71 - 87
 FL:        41  mm     G. Age:  23w 2d       39  %    FL/AC:      22.7   20 - 24
 CER:     24.3  mm     G. Age:  22w 2d       34  %

 Est. FW:     563  gm      1 lb 4 oz     50  %
Gestational Age

 U/S Today:     23w 1d                                        EDD:   05/15/11
 Best:          23w 2d     Det. By:  Early Ultrasound         EDD:   05/14/11
Anatomy

 Cranium:           Appears normal      Aortic Arch:       Appears normal
 Fetal Cavum:       Appears normal      Ductal Arch:       Appears normal
 Ventricles:        Appears normal      Diaphragm:         Appears normal
 Choroid Plexus:    Appears normal      Stomach:           Appears
                                                           normal, left
                                                           sided
 Cerebellum:        Appears normal      Abdomen:           Appears normal
 Posterior Fossa:   Appears normal      Abdominal Wall:    Appears nml
                                                           (cord insert,
                                                           abd wall)
 Nuchal Fold:       Not applicable      Cord Vessels:      Appears normal
                    (>20 wks GA)                           (3 vessel cord)
 Face:              Appears normal      Kidneys:           Appear normal
                    (lips/profile/orbit
                    s)
 Heart:             Appears normal      Bladder:           Appears normal
                    (4 chamber &
                    axis)
 RVOT:              Appears normal      Spine:             Appears normal
 LVOT:              Appears normal      Limbs:             Four extremities
                                                           visualized
                                                           (basic anatomy
                                                           exam)
Targeted Anatomy

 Fetal Central Nervous System
 Cisterna Magna:
Cervix Uterus Adnexa

 Cervical Length:    3.5      cm

 Cervix:       Normal appearance by transabdominal scan.
 Left Ovary:    Not visualized.
 Right Ovary:   Not visualized.

 Adnexa:     No abnormality visualized.
Impression

 Single live IUP in breech presentation.   Concordant
 measurements/assigned GA by US.
 No anatomic abnormality seen with a good quality survey
 possible.

 questions or concerns.

## 2012-06-27 ENCOUNTER — Other Ambulatory Visit

## 2012-10-24 ENCOUNTER — Other Ambulatory Visit: Payer: Self-pay | Admitting: Family Medicine

## 2012-10-24 ENCOUNTER — Other Ambulatory Visit (HOSPITAL_COMMUNITY)
Admission: RE | Admit: 2012-10-24 | Discharge: 2012-10-24 | Disposition: A | Discharge status: Home / Self Care | Payer: 59 | Source: Ambulatory Visit | Attending: Family Medicine | Admitting: Family Medicine

## 2012-10-24 DIAGNOSIS — Z1151 Encounter for screening for human papillomavirus (HPV): Secondary | ICD-10-CM | POA: Insufficient documentation

## 2012-10-24 DIAGNOSIS — Z124 Encounter for screening for malignant neoplasm of cervix: Secondary | ICD-10-CM | POA: Insufficient documentation

## 2012-10-24 DIAGNOSIS — R87810 Cervical high risk human papillomavirus (HPV) DNA test positive: Secondary | ICD-10-CM | POA: Insufficient documentation

## 2013-04-12 NOTE — ED Notes (Signed)
Patient relates awoke this morning with nausea and has vomited multiple times today, denies pain or diarrhea.

## 2013-04-12 NOTE — ED Provider Notes (Addendum)
HPI Comments: 10:11 PM. 04/12/2013    Julie Rosario is a 22 y.o. female who presents to the ED with complaints of nausea and vomiting since this morning. Pt reports having vomited too many times to remember. She states that she has not been able to tolerate PO intake. Pt states that she had some nausea 2 days ago, which resolved. She also states that she has been taking Niacin to "detox" because she is trying to get a new job. The pt says that she is currently having a genital herpes outbreak. She admits to 2 days of light vaginal discharge. Pt denies having sexual intercourse in the past 1.5 years. She says that her LMP ended last week, normal; she is G0P0A0. She denies diarrhea, fever, cough, cold Sx, abdominal pain. She has no other complaints at this time.     Patient denies PAIN.        The history is provided by the patient.        History reviewed. No pertinent past medical history.     History reviewed. No pertinent past surgical history.      History reviewed. No pertinent family history.     History     Social History   ??? Marital Status: SINGLE     Spouse Name: N/A     Number of Children: N/A   ??? Years of Education: N/A     Occupational History   ??? Not on file.     Social History Main Topics   ??? Smoking status: Never Smoker    ??? Smokeless tobacco: Not on file   ??? Alcohol Use: Yes      Comment: occasional   ??? Drug Use: Not on file   ??? Sexually Active: Not on file     Other Topics Concern   ??? Not on file     Social History Narrative   ??? No narrative on file                  ALLERGIES: Review of patient's allergies indicates no known allergies.      Review of Systems   Constitutional: Negative.  Negative for fever.   HENT: Negative.    Eyes: Negative.    Respiratory: Negative.    Cardiovascular: Negative.    Gastrointestinal: Positive for nausea and vomiting. Negative for abdominal pain and diarrhea.   Endocrine: Negative.    Genitourinary: Negative.    Musculoskeletal: Negative.    Skin: Negative.     Allergic/Immunologic: Negative.    Neurological: Negative.    Hematological: Negative.    Psychiatric/Behavioral: Negative.    All other systems reviewed and are negative.        Filed Vitals:    04/12/13 2130   BP: 130/64   Pulse: 96   Temp: 99 ??F (37.2 ??C)   Resp: 18   Height: 5\' 2"  (1.575 m)   Weight: 58.968 kg (130 lb)   SpO2: 100%            Physical Exam   Nursing note and vitals reviewed.  Constitutional: She is oriented to person, place, and time. She appears well-developed and well-nourished. No distress.   HENT:   Head: Normocephalic and atraumatic.   Right Ear: External ear normal.   Left Ear: External ear normal.   Nose: Nose normal.   Mouth/Throat: Oropharynx is clear and moist.   Eyes: Conjunctivae and EOM are normal. Pupils are equal, round, and reactive to light. No scleral icterus.  Neck: Normal range of motion. Neck supple. No JVD present. No tracheal deviation present. No thyromegaly present.   Cardiovascular: Normal rate, regular rhythm, normal heart sounds and intact distal pulses.  Exam reveals no gallop and no friction rub.    No murmur heard.  Pulmonary/Chest: Effort normal and breath sounds normal. She exhibits no tenderness.   Abdominal: Soft. Bowel sounds are normal. She exhibits no distension. There is no tenderness. There is no rebound and no guarding.   Negative Murphy's, Negative McBurney's    Musculoskeletal: Normal range of motion. She exhibits no edema and no tenderness.   Lymphadenopathy:     She has no cervical adenopathy.   Neurological: She is alert and oriented to person, place, and time. She has normal reflexes. No cranial nerve deficit. Coordination normal.   Skin: Skin is warm and dry.   Psychiatric: She has a normal mood and affect. Her behavior is normal. Judgment and thought content normal.        MDM    Procedures    Pulse Oximetry:       Medications ordered:      Medications - No data to display      Lab findings:     Labs Reviewed   URINALYSIS W/ RFLX MICROSCOPIC    HCG URINE, QL       EKG interpretation:    X-Ray, CT or other radiology findings or impressions:            Consult notes or additional Procedure notes:       Reevaluation of patient:   I have reevaluated patient. Patient is feeling better, exam benign.  LFT's elevated. Lipase okay.  Patient hydrated. No further vomiting here.  9604:  Awaiting ultrasound of RUQ at 0700.    Diagnosis:   vomiting        Discharge/Disposition:  Patient was  in stable condition.  Patient is to return to emergency department with any new or worsening condition.        Scribe Attestation:   April 12, 2013 at 10:08 PM Erin E. Winebarger scribing for and in the presence of Dr. Rona Ravens, MD     Oswaldo Conroy. Winebarger, Water engineer:   I personally performed the services described in the documentation, reviewed the documentation, as recorded by the scribe in my presence, and it accurately and completely records my words and actions. April 13, 2013 at 4:53 AM - Rona Ravens, MD        0700:  Signed out to Dr. Orson Aloe. Plan discussed. Ultrasound pending.7:39 AM  Pt examined and hx reviewed.  Pt states that she had the onset of vomiting yesterday at 11 which lasted until she came to the ED.  No pain associated with this.   Feeling better after fluids and anti-emetics.   With elevated LFT's Dr. Alva Garnet had ordered US, awaiting tech.  Abd:  Soft non-tender  9:27 AM  [place] : NAD

## 2013-04-13 LAB — URINALYSIS W/ RFLX MICROSCOPIC
Bilirubin: NEGATIVE
Blood: NEGATIVE
Glucose: NEGATIVE mg/dL
Ketone: 40 mg/dL — AB
Leukocyte Esterase: NEGATIVE
Nitrites: NEGATIVE
Protein: NEGATIVE mg/dL
Specific gravity: 1.02 (ref 1.003–1.030)
Urobilinogen: 0.2 EU/dL (ref 0.2–1.0)
pH (UA): 7.5 (ref 5.0–8.0)

## 2013-04-13 LAB — METABOLIC PANEL, COMPREHENSIVE
A-G Ratio: 1.2 (ref 0.8–1.7)
ALT (SGPT): 215 U/L — ABNORMAL HIGH (ref 12.0–78.0)
AST (SGOT): 179 U/L — ABNORMAL HIGH (ref 15–37)
Albumin: 4.2 g/dL (ref 3.4–5.0)
Alk. phosphatase: 150 U/L — ABNORMAL HIGH (ref 45–117)
Anion gap: 12 mmol/L (ref 3.0–18)
BUN/Creatinine ratio: 12 (ref 12–20)
BUN: 13 MG/DL (ref 7.0–18)
Bilirubin, total: 1.2 MG/DL — ABNORMAL HIGH (ref 0.2–1.0)
CO2: 25 mmol/L (ref 21–32)
Calcium: 9.2 MG/DL (ref 8.5–10.1)
Chloride: 101 mmol/L (ref 100–108)
Creatinine: 1.1 MG/DL (ref 0.6–1.3)
GFR est AA: >60 mL/min/{1.73_m2} (ref >60)
GFR est non-AA: >60 mL/min/{1.73_m2} (ref >60)
Globulin: 3.6 g/dL (ref 2.0–4.0)
Glucose: 112 mg/dL — ABNORMAL HIGH (ref 74–99)
Potassium: 3.8 mmol/L (ref 3.5–5.5)
Protein, total: 7.8 g/dL (ref 6.4–8.2)
Sodium: 138 mmol/L (ref 136–145)

## 2013-04-13 LAB — CBC W/O DIFF
HCT: 42 % (ref 35.0–45.0)
HGB: 13.7 g/dL (ref 12.0–16.0)
MCH: 30.3 PG (ref 24.0–34.0)
MCHC: 32.6 g/dL (ref 31.0–37.0)
MCV: 92.9 FL (ref 74.0–97.0)
MPV: 10.3 FL (ref 9.2–11.8)
PLATELET: 226 10*3/uL (ref 135–420)
RBC: 4.52 M/uL (ref 4.20–5.30)
RDW: 12.5 % (ref 11.6–14.5)
WBC: 13.8 10*3/uL — ABNORMAL HIGH (ref 4.6–13.2)

## 2013-04-13 LAB — LIPASE: Lipase: 131 U/L (ref 73–393)

## 2013-04-13 LAB — HCG URINE, QL: HCG urine, QL: NEGATIVE

## 2013-04-13 MED ADMIN — ondansetron (ZOFRAN) injection 4 mg: INTRAVENOUS | @ 06:00:00 | NDC 55150012502

## 2013-04-13 MED ADMIN — sodium chloride 0.9 % bolus infusion 1,000 mL: INTRAVENOUS | @ 06:00:00 | NDC 00409798309

## 2013-04-13 MED ADMIN — ondansetron (ZOFRAN) injection 4 mg: INTRAVENOUS | @ 02:00:00 | NDC 55150012502

## 2013-04-13 MED ADMIN — sodium chloride 0.9 % bolus infusion 1,000 mL: INTRAVENOUS | @ 02:00:00 | NDC 00409798309

## 2013-04-13 NOTE — ED Notes (Signed)
Pt resting on the stretcher. Pt aware awaiting for results. No concerns or complaints are made at this present time. Pt remains in NOAD.

## 2013-04-13 NOTE — ED Notes (Signed)
US repaged

## 2013-04-13 NOTE — ED Notes (Signed)
Assist pt out of bed to bathroom to void.  Ambulated without assistance and without difficulty.  Pt reports "feeling much better."

## 2013-04-13 NOTE — ED Notes (Signed)
Verbal report given to oncoming nurse, Cornelius Moras RN, with review of MAR, ED Summary, and patient plan of care with all questions answered for continuity of care.

## 2013-04-13 NOTE — ED Notes (Signed)
Pt affect calm, skin warm/dry, respirations regular/non labored.  Pt denies pain/nausea; no vomiting noted.  Per Dr Irene Limbo permission pt given ice chips.  Plan of care has been explained.  Call light in reach.

## 2013-04-13 NOTE — ED Notes (Signed)
US tech paged

## 2013-04-13 NOTE — ED Notes (Addendum)
Adjust HOB for POC per pt request.  Call bell placed within reach.  Denies further needs or concerns.  Pt willing to remain in ED for [place] after tech arrives at 0700.

## 2013-04-13 NOTE — ED Notes (Signed)
Pt states ready for discharge. Pt states she will follow up with PCP as instructed by provider. Pt appears in NOAD.    I have reviewed discharge instructions with the patient. Prescriptions were reviewed with patient. The patient verbalized understanding. Patient seen leaving ED ambulatory without difficulty or need for assistance, patient in no sign of distress. Patient armband removed and shredded.

## 2013-04-13 NOTE — ED Notes (Signed)
[place] tech at the bedside to perform studies. Pt resting on the stretcher. Pt appears in NOAD.

## 2013-04-13 NOTE — ED Notes (Signed)
Pt is asleep at this time; report has been given to Fernanda Drum RN.

## 2013-04-13 NOTE — ED Notes (Signed)
Contact made with Asher Muir ([place] tech); is en route

## 2013-10-22 LAB — OB RESULTS CONSOLE RPR: RPR: NONREACTIVE

## 2013-10-22 LAB — OB RESULTS CONSOLE ABO/RH: RH TYPE: POSITIVE

## 2013-10-22 LAB — OB RESULTS CONSOLE HIV ANTIBODY (ROUTINE TESTING): HIV: NONREACTIVE

## 2013-10-22 LAB — OB RESULTS CONSOLE ANTIBODY SCREEN: Antibody Screen: NEGATIVE

## 2013-11-08 ENCOUNTER — Other Ambulatory Visit: Payer: Self-pay | Admitting: Obstetrics and Gynecology

## 2013-11-08 ENCOUNTER — Other Ambulatory Visit (HOSPITAL_COMMUNITY)
Admission: RE | Admit: 2013-11-08 | Discharge: 2013-11-08 | Disposition: A | Discharge status: Home / Self Care | Payer: 59 | Source: Ambulatory Visit | Attending: Obstetrics and Gynecology | Admitting: Obstetrics and Gynecology

## 2013-11-08 DIAGNOSIS — Z01419 Encounter for gynecological examination (general) (routine) without abnormal findings: Secondary | ICD-10-CM | POA: Insufficient documentation

## 2013-11-08 DIAGNOSIS — Z113 Encounter for screening for infections with a predominantly sexual mode of transmission: Secondary | ICD-10-CM | POA: Insufficient documentation

## 2013-11-17 ENCOUNTER — Inpatient Hospital Stay (HOSPITAL_COMMUNITY)
Admission: AD | Admit: 2013-11-17 | Discharge: 2013-11-17 | Disposition: A | Discharge status: Home / Self Care | Payer: 59 | Source: Ambulatory Visit | Attending: Obstetrics and Gynecology | Admitting: Obstetrics and Gynecology

## 2013-11-17 ENCOUNTER — Inpatient Hospital Stay (HOSPITAL_COMMUNITY): Payer: 59

## 2013-11-17 ENCOUNTER — Encounter (HOSPITAL_COMMUNITY): Payer: Self-pay | Admitting: Family

## 2013-11-17 DIAGNOSIS — N83209 Unspecified ovarian cyst, unspecified side: Secondary | ICD-10-CM | POA: Diagnosis not present

## 2013-11-17 DIAGNOSIS — O34599 Maternal care for other abnormalities of gravid uterus, unspecified trimester: Secondary | ICD-10-CM | POA: Insufficient documentation

## 2013-11-17 DIAGNOSIS — R109 Unspecified abdominal pain: Secondary | ICD-10-CM | POA: Insufficient documentation

## 2013-11-17 DIAGNOSIS — O2390 Unspecified genitourinary tract infection in pregnancy, unspecified trimester: Secondary | ICD-10-CM | POA: Diagnosis not present

## 2013-11-17 DIAGNOSIS — B9689 Other specified bacterial agents as the cause of diseases classified elsewhere: Secondary | ICD-10-CM | POA: Diagnosis not present

## 2013-11-17 DIAGNOSIS — N760 Acute vaginitis: Secondary | ICD-10-CM | POA: Insufficient documentation

## 2013-11-17 DIAGNOSIS — A499 Bacterial infection, unspecified: Secondary | ICD-10-CM | POA: Diagnosis not present

## 2013-11-17 DIAGNOSIS — Z87891 Personal history of nicotine dependence: Secondary | ICD-10-CM | POA: Diagnosis not present

## 2013-11-17 DIAGNOSIS — N83201 Unspecified ovarian cyst, right side: Secondary | ICD-10-CM

## 2013-11-17 LAB — COMPREHENSIVE METABOLIC PANEL
ALBUMIN: 2.7 g/dL — AB (ref 3.5–5.2)
ALT: 9 U/L (ref 0–35)
AST: 12 U/L (ref 0–37)
Alkaline Phosphatase: 85 U/L (ref 39–117)
BUN: 6 mg/dL (ref 6–23)
CALCIUM: 8.8 mg/dL (ref 8.4–10.5)
CO2: 23 mEq/L (ref 19–32)
CREATININE: 0.82 mg/dL (ref 0.50–1.10)
Chloride: 102 mEq/L (ref 96–112)
GFR calc Af Amer: >90 mL/min (ref >90)
GFR calc non Af Amer: >90 mL/min (ref >90)
Glucose, Bld: 96 mg/dL (ref 70–99)
Potassium: 3.7 mEq/L (ref 3.7–5.3)
Sodium: 136 mEq/L — ABNORMAL LOW (ref 137–147)
TOTAL PROTEIN: 7.1 g/dL (ref 6.0–8.3)
Total Bilirubin: 0.3 mg/dL (ref 0.3–1.2)

## 2013-11-17 LAB — CBC WITH DIFFERENTIAL/PLATELET
BASOS ABS: 0 10*3/uL (ref 0.0–0.1)
BASOS PCT: 0 % (ref 0–1)
EOS PCT: 0 % (ref 0–5)
Eosinophils Absolute: 0.1 10*3/uL (ref 0.0–0.7)
HCT: 34.8 % — ABNORMAL LOW (ref 36.0–46.0)
Hemoglobin: 12.4 g/dL (ref 12.0–15.0)
LYMPHS PCT: 13 % (ref 12–46)
Lymphs Abs: 1.7 10*3/uL (ref 0.7–4.0)
MCH: 27.4 pg (ref 26.0–34.0)
MCHC: 35.6 g/dL (ref 30.0–36.0)
MCV: 76.8 fL — AB (ref 78.0–100.0)
Monocytes Absolute: 0.7 10*3/uL (ref 0.1–1.0)
Monocytes Relative: 5 % (ref 3–12)
Neutro Abs: 10.9 10*3/uL — ABNORMAL HIGH (ref 1.7–7.7)
Neutrophils Relative %: 82 % — ABNORMAL HIGH (ref 43–77)
PLATELETS: 206 10*3/uL (ref 150–400)
RBC: 4.53 MIL/uL (ref 3.87–5.11)
RDW: 13.9 % (ref 11.5–15.5)
WBC: 13.4 10*3/uL — ABNORMAL HIGH (ref 4.0–10.5)

## 2013-11-17 LAB — URINALYSIS, ROUTINE W REFLEX MICROSCOPIC
BILIRUBIN URINE: NEGATIVE
Glucose, UA: NEGATIVE mg/dL
Hgb urine dipstick: NEGATIVE
KETONES UR: NEGATIVE mg/dL
LEUKOCYTES UA: NEGATIVE
NITRITE: NEGATIVE
PROTEIN: NEGATIVE mg/dL
Specific Gravity, Urine: 1.025 (ref 1.005–1.030)
UROBILINOGEN UA: 0.2 mg/dL (ref 0.0–1.0)
pH: 5.5 (ref 5.0–8.0)

## 2013-11-17 LAB — WET PREP, GENITAL
TRICH WET PREP: NONE SEEN
Yeast Wet Prep HPF POC: NONE SEEN

## 2013-11-17 MED ORDER — OXYCODONE-ACETAMINOPHEN 5-325 MG PO TABS: 2.0000 | ORAL_TABLET | ORAL | Status: DC | PRN

## 2013-11-17 MED ORDER — METRONIDAZOLE 500 MG PO TABS: 500.0000 mg | ORAL_TABLET | Freq: Two times a day (BID) | ORAL | Status: DC

## 2013-11-17 MED ORDER — METRONIDAZOLE 0.75 % VA GEL: 1.0000 | Freq: Two times a day (BID) | VAGINAL | Status: DC

## 2013-11-17 MED ORDER — HYDROMORPHONE HCL PF 1 MG/ML IJ SOLN
1.0000 mg | Freq: Once | INTRAMUSCULAR | Status: AC
  Administered 2013-11-17: 1 mg via INTRAMUSCULAR
  Filled 2013-11-17: qty 1

## 2013-11-17 MED ORDER — OXYCODONE-ACETAMINOPHEN 5-325 MG PO TABS: 1.0000 | ORAL_TABLET | ORAL | Status: DC | PRN

## 2013-11-17 NOTE — Progress Notes (Signed)
In to assess pt.  Discussed history, exam and work up with NP.  Pt sleeping quietly s/p Dilaudid.  Unable to give history due to sedation but was able to nod.  Acute onset in RLQ. Abnormal discharge per NP.  Denies GI symptoms per NP.  AFVSS +FHTs Gen:  NAD Abdomen:  Slightly distended, soft, no rebound or guarding.  RLQ pain min-moderate Pelvis deferred.  Odor c/w BV or Trich.  Labs reviewed. Wet prep pending.  Acute RLQ pain. Ultrasound ordered to rule out ovarian torsion, confirm IUP and assess appendix. Dilaudid IV prn. If ultrasound normal, will order CT scan to rule out appendicitis.  WBC normal so unlikely. F/u wet prep.

## 2013-11-17 NOTE — MAU Provider Note (Signed)
History     CSN: [id number]  Arrival date and time: 11/17/13 1055   First Provider Initiated Contact with Patient 11/17/13 1139      Chief Complaint  Patient presents with  . Abdominal Pain   HPI  [name] is a 23 y.o. female G2P1001 at [redacted]w[redacted]d who presents with acute onset of right-mid lower abdominal pain. Pt was getting ready to go eat breakfast and felt sharp pain in her right side. Pt says " I feel like I have a bad stomach ache". Pt denies fever, nausea, vomiting, diarrhea or constipation. Family is with the patient and is concerned this may be gas pain given the patient eats a lot of "meatless meat; and vegetables".   OB History   Grav Para Term Preterm Abortions TAB SAB Ect Mult Living   '2 1 1       1      '$ Past Medical History  Diagnosis Date  . No pertinent past medical history     Past Surgical History  Procedure Laterality Date  . Cesarean section  05/22/2011    Procedure: CESAREAN SECTION;  Surgeon: Frederico Hamman, MD;  Location: [facility];  Service: Gynecology;  Laterality: N/A;    Family History  Problem Relation Age of Onset  . Asthma Mother   . Miscarriages / [place] Mother   . Alcohol abuse Maternal Grandmother   . Alcohol abuse Maternal Grandfather   . Alcohol abuse Paternal Grandfather   . Stroke Paternal Grandfather     History  Substance Use Topics  . Smoking status: Former Smoker -- 0.25 packs/day for 2 years    Types: Cigarettes    Quit date: 09/16/2013  . Smokeless tobacco: Not on file  . Alcohol Use: No    Allergies:  Allergies  Allergen Reactions  . Iodine Swelling  . Latex Itching    No prescriptions prior to admission   Results for orders placed during the hospital encounter of 11/17/13 (from the past 48 hour(s))  CBC WITH DIFFERENTIAL     Status: Abnormal   Collection Time    11/17/13 11:30 AM      Result Value Ref Range   WBC 13.4 (*) 4.0 - 10.5 K/uL   RBC 4.53  3.87 - 5.11 MIL/uL   Hemoglobin 12.4  12.0 -  15.0 g/dL   HCT 34.8 (*) 36.0 - 46.0 %   MCV 76.8 (*) 78.0 - 100.0 fL   MCH 27.4  26.0 - 34.0 pg   MCHC 35.6  30.0 - 36.0 g/dL   RDW 13.9  11.5 - 15.5 %   Platelets 206  150 - 400 K/uL   Neutrophils Relative % 82 (*) 43 - 77 %   Neutro Abs 10.9 (*) 1.7 - 7.7 K/uL   Lymphocytes Relative 13  12 - 46 %   Lymphs Abs 1.7  0.7 - 4.0 K/uL   Monocytes Relative 5  3 - 12 %   Monocytes Absolute 0.7  0.1 - 1.0 K/uL   Eosinophils Relative 0  0 - 5 %   Eosinophils Absolute 0.1  0.0 - 0.7 K/uL   Basophils Relative 0  0 - 1 %   Basophils Absolute 0.0  0.0 - 0.1 K/uL  COMPREHENSIVE METABOLIC PANEL     Status: Abnormal   Collection Time    11/17/13 11:30 AM      Result Value Ref Range   Sodium 136 (*) 137 - 147 mEq/L   Potassium 3.7  3.7 -  5.3 mEq/L   Chloride 102  96 - 112 mEq/L   CO2 23  19 - 32 mEq/L   Glucose, Bld 96  70 - 99 mg/dL   BUN 6  6 - 23 mg/dL   Creatinine, Ser 0.82  0.50 - 1.10 mg/dL   Calcium 8.8  8.4 - 10.5 mg/dL   Total Protein 7.1  6.0 - 8.3 g/dL   Albumin 2.7 (*) 3.5 - 5.2 g/dL   AST 12  0 - 37 U/L   ALT 9  0 - 35 U/L   Alkaline Phosphatase 85  39 - 117 U/L   Total Bilirubin 0.3  0.3 - 1.2 mg/dL   GFR calc non Af Amer >90  >90 mL/min   GFR calc Af Amer >90  >90 mL/min   Comment: (NOTE)     The eGFR has been calculated using the CKD EPI equation.     This calculation has not been validated in all clinical situations.     eGFR's persistently <90 mL/min signify possible Chronic Kidney     Disease.  WET PREP, GENITAL     Status: Abnormal   Collection Time    11/17/13 11:44 AM      Result Value Ref Range   Yeast Wet Prep HPF POC NONE SEEN  NONE SEEN   Trich, Wet Prep NONE SEEN  NONE SEEN   Clue Cells Wet Prep HPF POC FEW (*) NONE SEEN   WBC, Wet Prep HPF POC FEW (*) NONE SEEN   Comment: MANY BACTERIA SEEN  URINALYSIS, ROUTINE W REFLEX MICROSCOPIC     Status: None   Collection Time    11/17/13  1:34 PM      Result Value Ref Range   Color, Urine YELLOW  YELLOW    APPearance CLEAR  CLEAR   Specific Gravity, Urine 1.025  1.005 - 1.030   pH 5.5  5.0 - 8.0   Glucose, UA NEGATIVE  NEGATIVE mg/dL   Hgb urine dipstick NEGATIVE  NEGATIVE   Bilirubin Urine NEGATIVE  NEGATIVE   Ketones, ur NEGATIVE  NEGATIVE mg/dL   Protein, ur NEGATIVE  NEGATIVE mg/dL   Urobilinogen, UA 0.2  0.0 - 1.0 mg/dL   Nitrite NEGATIVE  NEGATIVE   Leukocytes, UA NEGATIVE  NEGATIVE   Comment: MICROSCOPIC NOT DONE ON URINES WITH NEGATIVE PROTEIN, BLOOD, LEUKOCYTES, NITRITE, OR GLUCOSE <1000 mg/dL.   US Pelvis Complete  11/17/2013   CLINICAL DATA:  Pregnant, severe mid to lower right-side abdominal pain question ovarian torsion or appendicitis  EXAM: TRANSABDOMINAL ULTRASOUND OF PELVIS  DOPPLER ULTRASOUND OF OVARIES  TECHNIQUE: Transabdominal ultrasound examination of the pelvis was performed including evaluation of the uterus, ovaries, adnexal regions, and pelvic cul-de-sac. Dedicated sonography of the pregnant uterus was not performed. Transvaginal imaging was not required to assess blood flow within the ovaries.  Color and duplex Doppler ultrasound was utilized to evaluate blood flow to the ovaries.  COMPARISON:  None  FINDINGS: Uterus  16 week intrauterine gestation is present, not assessed on this exam.  Endometrium  N/A  Right ovary  Measurements: 4.9 x 3.0 x 3.4 cm. Small corpus luteal cyst 2.7 x 3.0 x 2.8 cm. No additional ovarian masses. Internal blood flow present within right ovary on color Doppler imaging.  Left ovary  Measurements: 3.7 x 1.7 x 1.7 cm. Normal morphology without mass. Internal blood flow present within left ovary on color Doppler imaging.  Pulsed Doppler evaluation demonstrates normal low-resistance arterial and venous waveforms in both  ovaries.  No free pelvic fluid.  Appendix is not visualized.  IMPRESSION: Gravid uterus, not assessed on this exam ; if the pregnancy requires evaluation recommend dedicated OB ultrasound.  Normal appearing left ovary.  Small corpus luteal  cyst within right ovary.  No evidence of ovarian mass or torsion.  Appendix is not visualized. Failure to visualize an enlarged appendix does not exclude acute appendicitis. If this remains a clinical concern in this pregnant patient, recommend MR imaging using the appendicitis protocol.   Electronically Signed   By: Lavonia Dana M.D.   On: 11/17/2013 13:07   [place] Art/ven Flow Abd Pelv Doppler  11/17/2013   CLINICAL DATA:  Pregnant, severe mid to lower right-side abdominal pain question ovarian torsion or appendicitis  EXAM: TRANSABDOMINAL ULTRASOUND OF PELVIS  DOPPLER ULTRASOUND OF OVARIES  TECHNIQUE: Transabdominal ultrasound examination of the pelvis was performed including evaluation of the uterus, ovaries, adnexal regions, and pelvic cul-de-sac. Dedicated sonography of the pregnant uterus was not performed. Transvaginal imaging was not required to assess blood flow within the ovaries.  Color and duplex Doppler ultrasound was utilized to evaluate blood flow to the ovaries.  COMPARISON:  None  FINDINGS: Uterus  16 week intrauterine gestation is present, not assessed on this exam.  Endometrium  N/A  Right ovary  Measurements: 4.9 x 3.0 x 3.4 cm. Small corpus luteal cyst 2.7 x 3.0 x 2.8 cm. No additional ovarian masses. Internal blood flow present within right ovary on color Doppler imaging.  Left ovary  Measurements: 3.7 x 1.7 x 1.7 cm. Normal morphology without mass. Internal blood flow present within left ovary on color Doppler imaging.  Pulsed Doppler evaluation demonstrates normal low-resistance arterial and venous waveforms in both ovaries.  No free pelvic fluid.  Appendix is not visualized.  IMPRESSION: Gravid uterus, not assessed on this exam ; if the pregnancy requires evaluation recommend dedicated OB ultrasound.  Normal appearing left ovary.  Small corpus luteal cyst within right ovary.  No evidence of ovarian mass or torsion.  Appendix is not visualized. Failure to visualize an enlarged appendix does  not exclude acute appendicitis. If this remains a clinical concern in this pregnant patient, recommend MR imaging using the appendicitis protocol.   Electronically Signed   By: Lavonia Dana M.D.   On: 11/17/2013 13:07     Review of Systems  Constitutional: Negative for fever and chills.  Gastrointestinal: Positive for abdominal pain (+ mid-right lower abdominal pain ). Negative for nausea, vomiting, diarrhea and constipation.  Genitourinary: Negative for dysuria, urgency, frequency, hematuria and flank pain.       No vaginal discharge. No vaginal bleeding. No dysuria.    Physical Exam   Blood pressure 134/75, pulse 98, temperature 97.5 F (36.4 C), temperature source Oral, resp. rate 18, last menstrual period 08/07/2013, unknown if currently breastfeeding.   Physical Exam  Constitutional: She is oriented to person, place, and time. Vital signs are normal. She appears well-developed and well-nourished.  Non-toxic appearance. She does not have a sickly appearance. She does not appear ill. She appears distressed.  On arrival patient was crying while holding her lower abdomen   HENT:  Head: Normocephalic.  Eyes: Pupils are equal, round, and reactive to light.  Neck: Neck supple.  Respiratory: Effort normal.  GI: There is tenderness in the right lower quadrant and suprapubic area.  Genitourinary: Vaginal discharge found.  Speculum exam: Vagina - Small-moderate amount of creamy, pink discharge, + foul odor Cervix - No contact bleeding, no active  bleeding  Bimanual exam: Cervix closed Uterus non tender, gravid  Adnexa non tender, no masses bilaterally, negative suprapubic tenderness  GC/Chlam, wet prep done Chaperone present for exam.   Musculoskeletal: Normal range of motion.  Neurological: She is alert and oriented to person, place, and time.  Skin: She is not diaphoretic.  Psychiatric: Her mood appears anxious. Her speech is delayed.    MAU Course   Procedures None  MDM CBC  CMET UA +fht 1 mg of dilaudid given IM  Consulted with Dr. Linus Galas prep GC/Chlamydia  B positive blood type  1220: Dr. Simona Huh at the bedside to see the patient.  Discussed US findings with Dr. Simona Huh; plan to discharge home with pain medication of ovarian cysts. Low suspicion for appendicitis given neg N/V, neg white count, no fever; confirmed right ovarian cysts on [place].  Pt was able to ambulate to the bathroom with assistance without difficulty; family to take patient home   Assessment and Plan   A:  Abdominal pain in pregnancy  Right ovarian cysts; no sign of ovarian torsion Bacterial vaginosis; patient desires MetroGel over flagyl   P:  Discharge home in stable condition RX: percocet        Metrogel  Follow up with Dr. Landry Mellow in 1 week; sooner if symptoms do not improve Return to MAU as needed, if symptoms worsen   Darrelyn Hillock Rasch, NP  11/17/2013, 3:46 PM

## 2013-11-17 NOTE — Discharge Instructions (Signed)

## 2013-11-18 LAB — GC/CHLAMYDIA PROBE AMP
CT PROBE, AMP APTIMA: NEGATIVE
GC PROBE AMP APTIMA: NEGATIVE

## 2014-02-26 ENCOUNTER — Encounter (HOSPITAL_COMMUNITY): Payer: Self-pay | Admitting: *Deleted

## 2014-02-26 ENCOUNTER — Inpatient Hospital Stay (HOSPITAL_COMMUNITY)
Admission: AD | Admit: 2014-02-26 | Discharge: 2014-02-26 | Disposition: A | Discharge status: Home / Self Care | Payer: 59 | Source: Ambulatory Visit | Attending: Obstetrics and Gynecology | Admitting: Obstetrics and Gynecology

## 2014-02-26 DIAGNOSIS — M549 Dorsalgia, unspecified: Secondary | ICD-10-CM | POA: Diagnosis present

## 2014-02-26 DIAGNOSIS — O99891 Other specified diseases and conditions complicating pregnancy: Secondary | ICD-10-CM | POA: Diagnosis not present

## 2014-02-26 DIAGNOSIS — Z87891 Personal history of nicotine dependence: Secondary | ICD-10-CM | POA: Insufficient documentation

## 2014-02-26 DIAGNOSIS — O9989 Other specified diseases and conditions complicating pregnancy, childbirth and the puerperium: Secondary | ICD-10-CM

## 2014-02-26 LAB — URINE MICROSCOPIC-ADD ON

## 2014-02-26 LAB — URINALYSIS, ROUTINE W REFLEX MICROSCOPIC
Bilirubin Urine: NEGATIVE
Glucose, UA: NEGATIVE mg/dL
HGB URINE DIPSTICK: NEGATIVE
KETONES UR: NEGATIVE mg/dL
NITRITE: NEGATIVE
Protein, ur: NEGATIVE mg/dL
Specific Gravity, Urine: 1.025 (ref 1.005–1.030)
UROBILINOGEN UA: 0.2 mg/dL (ref 0.0–1.0)
pH: 6 (ref 5.0–8.0)

## 2014-02-26 LAB — WET PREP, GENITAL
CLUE CELLS WET PREP: NONE SEEN
Trich, Wet Prep: NONE SEEN
Yeast Wet Prep HPF POC: NONE SEEN

## 2014-02-26 MED ORDER — OXYCODONE-ACETAMINOPHEN 5-325 MG PO TABS
2.0000 | ORAL_TABLET | Freq: Once | ORAL | Status: AC
  Administered 2014-02-26: 2 via ORAL
  Filled 2014-02-26: qty 2

## 2014-02-26 MED ORDER — CYCLOBENZAPRINE HCL 10 MG PO TABS
10.0000 mg | ORAL_TABLET | Freq: Once | ORAL | Status: AC
  Administered 2014-02-26: 10 mg via ORAL
  Filled 2014-02-26: qty 1

## 2014-02-26 NOTE — Discharge Instructions (Signed)
Back Exercises Back exercises help treat and prevent back injuries. The goal of back exercises is to increase the strength of your abdominal and back muscles and the flexibility of your back. These exercises should be started when you no longer have back pain. Back exercises include:  Pelvic Tilt. Lie on your back with your knees bent. Tilt your pelvis until the lower part of your back is against the floor. Hold this position 5 to 10 sec and repeat 5 to 10 times.  Knee to Chest. Pull first 1 knee up against your chest and hold for 20 to 30 seconds, repeat this with the other knee, and then both knees. This may be done with the other leg straight or bent, whichever feels better.  Sit-Ups or Curl-Ups. Bend your knees 90 degrees. Start with tilting your pelvis, and do a partial, slow sit-up, lifting your trunk only 30 to 45 degrees off the floor. Take at least 2 to 3 seconds for each sit-up. Do not do sit-ups with your knees out straight. If partial sit-ups are difficult, simply do the above but with only tightening your abdominal muscles and holding it as directed.  Hip-Lift. Lie on your back with your knees flexed 90 degrees. Push down with your feet and shoulders as you raise your hips a couple inches off the floor; hold for 10 seconds, repeat 5 to 10 times.  Back arches. Lie on your stomach, propping yourself up on bent elbows. Slowly press on your hands, causing an arch in your low back. Repeat 3 to 5 times. Any initial stiffness and discomfort should lessen with repetition over time.  Shoulder-Lifts. Lie face down with arms beside your body. Keep hips and torso pressed to floor as you slowly lift your head and shoulders off the floor. Do not overdo your exercises, especially in the beginning. Exercises may cause you some mild back discomfort which lasts for a few minutes; however, if the pain is more severe, or lasts for more than 15 minutes, do not continue exercises until you see your caregiver.  Improvement with exercise therapy for back problems is slow.  See your caregivers for assistance with developing a proper back exercise program. Document Released: 10/20/2004 Document Revised: 12/05/2011 Document Reviewed: 07/14/2011 [facility] Patient Information 2014 [place], [place].  Third Trimester of Pregnancy The third trimester is from week 29 through week 42, months 7 through 9. The third trimester is a time when the fetus is growing rapidly. At the end of the ninth month, the fetus is about 20 inches in length and weighs 6 10 pounds.  BODY CHANGES Your body goes through many changes during pregnancy. The changes vary from woman to woman.   Your weight will continue to increase. You can expect to gain 25 35 pounds (11 16 kg) by the end of the pregnancy.  You may begin to get stretch marks on your hips, abdomen, and breasts.  You may urinate more often because the fetus is moving lower into your pelvis and pressing on your bladder.  You may develop or continue to have heartburn as a result of your pregnancy.  You may develop constipation because certain hormones are causing the muscles that push waste through your intestines to slow down.  You may develop hemorrhoids or swollen, bulging veins (varicose veins).  You may have pelvic pain because of the weight gain and pregnancy hormones relaxing your joints between the bones in your pelvis. Back aches may result from over exertion of the muscles supporting  your posture.  Your breasts will continue to grow and be tender. A yellow discharge may leak from your breasts called colostrum.  Your belly button may stick out.  You may feel short of breath because of your expanding uterus.  You may notice the fetus "dropping," or moving lower in your abdomen.  You may have a bloody mucus discharge. This usually occurs a few days to a week before labor begins.  Your cervix becomes thin and soft (effaced) near your due date. WHAT TO EXPECT  AT YOUR PRENATAL EXAMS  You will have prenatal exams every 2 weeks until week 36. Then, you will have weekly prenatal exams. During a routine prenatal visit:  You will be weighed to make sure you and the fetus are growing normally.  Your blood pressure is taken.  Your abdomen will be measured to track your baby's growth.  The fetal heartbeat will be listened to.  Any test results from the previous visit will be discussed.  You may have a cervical check near your due date to see if you have effaced. At around 36 weeks, your caregiver will check your cervix. At the same time, your caregiver will also perform a test on the secretions of the vaginal tissue. This test is to determine if a type of bacteria, Group B streptococcus, is present. Your caregiver will explain this further. Your caregiver may ask you:  What your birth plan is.  How you are feeling.  If you are feeling the baby move.  If you have had any abnormal symptoms, such as leaking fluid, bleeding, severe headaches, or abdominal cramping.  If you have any questions. Other tests or screenings that may be performed during your third trimester include:  Blood tests that check for low iron levels (anemia).  Fetal testing to check the health, activity level, and growth of the fetus. Testing is done if you have certain medical conditions or if there are problems during the pregnancy. FALSE LABOR You may feel small, irregular contractions that eventually go away. These are called Braxton Hicks contractions, or false labor. Contractions may last for hours, days, or even weeks before true labor sets in. If contractions come at regular intervals, intensify, or become painful, it is best to be seen by your caregiver.  SIGNS OF LABOR   Menstrual-like cramps.  Contractions that are 5 minutes apart or less.  Contractions that start on the top of the uterus and spread down to the lower abdomen and back.  A sense of increased pelvic  pressure or back pain.  A watery or bloody mucus discharge that comes from the vagina. If you have any of these signs before the 37th week of pregnancy, call your caregiver right away. You need to go to the hospital to get checked immediately. HOME CARE INSTRUCTIONS   Avoid all smoking, herbs, alcohol, and unprescribed drugs. These chemicals affect the formation and growth of the baby.  Follow your caregiver's instructions regarding medicine use. There are medicines that are either safe or unsafe to take during pregnancy.  Exercise only as directed by your caregiver. Experiencing uterine cramps is a good sign to stop exercising.  Continue to eat regular, healthy meals.  Wear a good support bra for breast tenderness.  Do not use hot tubs, steam rooms, or saunas.  Wear your seat belt at all times when driving.  Avoid raw meat, uncooked cheese, cat litter boxes, and soil used by cats. These carry germs that can cause birth defects in  the baby.  Take your prenatal vitamins.  Try taking a stool softener (if your caregiver approves) if you develop constipation. Eat more high-fiber foods, such as fresh vegetables or fruit and whole grains. Drink plenty of fluids to keep your urine clear or pale yellow.  Take warm sitz baths to soothe any pain or discomfort caused by hemorrhoids. Use hemorrhoid cream if your caregiver approves.  If you develop varicose veins, wear support hose. Elevate your feet for 15 minutes, 3 4 times a day. Limit salt in your diet.  Avoid heavy lifting, wear low heal shoes, and practice good posture.  Rest a lot with your legs elevated if you have leg cramps or low back pain.  Visit your dentist if you have not gone during your pregnancy. Use a soft toothbrush to brush your teeth and be gentle when you floss.  A sexual relationship may be continued unless your caregiver directs you otherwise.  Do not travel far distances unless it is absolutely necessary and only  with the approval of your caregiver.  Take prenatal classes to understand, practice, and ask questions about the labor and delivery.  Make a trial run to the hospital.  Pack your hospital bag.  Prepare the baby's nursery.  Continue to go to all your prenatal visits as directed by your caregiver. SEEK MEDICAL CARE IF:  You are unsure if you are in labor or if your water has broken.  You have dizziness.  You have mild pelvic cramps, pelvic pressure, or nagging pain in your abdominal area.  You have persistent nausea, vomiting, or diarrhea.  You have a bad smelling vaginal discharge.  You have pain with urination. SEEK IMMEDIATE MEDICAL CARE IF:   You have a fever.  You are leaking fluid from your vagina.  You have spotting or bleeding from your vagina.  You have severe abdominal cramping or pain.  You have rapid weight loss or gain.  You have shortness of breath with chest pain.  You notice sudden or extreme swelling of your face, hands, ankles, feet, or legs.  You have not felt your baby move in over an hour.  You have severe headaches that do not go away with medicine.  You have vision changes. Document Released: 09/06/2001 Document Revised: 05/15/2013 Document Reviewed: 11/13/2012 [facility] Patient Information 2014 [place], [place].

## 2014-02-26 NOTE — MAU Provider Note (Signed)
History     CSN: [id number]  Arrival date and time: 02/26/14 [id number]   First Provider Initiated Contact with Patient 02/26/14 2012      Chief Complaint  Patient presents with  . Back Pain   Back Pain    Joanne Ellis is a 23 y.o. G2P1001 at 3027w0d who presents today with 8-9/10 back pain. She states that the pain started last night, and has gotten worse over the day today. She states that she tried one tylenol yesterday, and it did not help the pain. She has not tried anything else, and she has not called her Drs office to discuss the pain. She states that the pain is constant and is worse with movement. She denies any VB, LOF, UCs, vaginal discharge. She denies any urinary sx or fever.   Past Medical History  Diagnosis Date  . No pertinent past medical history     Past Surgical History  Procedure Laterality Date  . Cesarean section  05/22/2011    Procedure: CESAREAN SECTION;  Surgeon: Kathreen CosierBernard A Marshall, MD;  Location: [facility];  Service: Gynecology;  Laterality: N/A;    Family History  Problem Relation Age of Onset  . Asthma Mother   . Miscarriages / [place] Mother   . Alcohol abuse Maternal Grandmother   . Alcohol abuse Maternal Grandfather   . Alcohol abuse Paternal Grandfather   . Stroke Paternal Grandfather     History  Substance Use Topics  . Smoking status: Former Smoker -- 0.25 packs/day for 2 years    Types: Cigarettes    Quit date: 09/16/2013  . Smokeless tobacco: Not on file  . Alcohol Use: No    Allergies:  Allergies  Allergen Reactions  . Iodine Swelling  . Latex Itching    Prescriptions prior to admission  Medication Sig Dispense Refill  . acetaminophen (TYLENOL) 325 MG tablet Take by mouth every 6 (six) hours as needed for moderate pain.      . Prenatal Vit-Fe Fumarate-FA (PRENATAL MULTIVITAMIN) TABS tablet Take 1 tablet by mouth daily at 12 noon.        Review of Systems  Musculoskeletal: Positive for back pain.   Physical Exam   Blood  pressure 110/59, pulse 78, temperature 99 F (37.2 C), temperature source Oral, resp. rate 18, last menstrual period 08/07/2013, SpO2 100.00%, not currently breastfeeding.  Physical Exam  Nursing note and vitals reviewed. Constitutional: She is oriented to person, place, and time. She appears well-developed and well-nourished. No distress.  Cardiovascular: Normal rate.   Respiratory: Effort normal.  GI: Soft. There is no tenderness. There is no rebound.  Genitourinary:  No CVA tenderness  Small amount of white discharge seen Cervix closed/thick/high  Neurological: She is alert and oriented to person, place, and time.  Skin: Skin is warm and dry.  Psychiatric: She has a normal mood and affect.   FHT 145, moderate with 15x15 accels, no decels Toco: no UCs   MAU Course  Procedures  Results for orders placed during the hospital encounter of 02/26/14 (from the past 24 hour(s))  URINALYSIS, ROUTINE W REFLEX MICROSCOPIC     Status: Abnormal   Collection Time    02/26/14  7:38 PM      Result Value Ref Range   Color, Urine YELLOW  YELLOW   APPearance CLEAR  CLEAR   Specific Gravity, Urine 1.025  1.005 - 1.030   pH 6.0  5.0 - 8.0   Glucose, UA NEGATIVE  NEGATIVE mg/dL   Hgb  urine dipstick NEGATIVE  NEGATIVE   Bilirubin Urine NEGATIVE  NEGATIVE   Ketones, ur NEGATIVE  NEGATIVE mg/dL   Protein, ur NEGATIVE  NEGATIVE mg/dL   Urobilinogen, UA 0.2  0.0 - 1.0 mg/dL   Nitrite NEGATIVE  NEGATIVE   Leukocytes, UA TRACE (*) NEGATIVE  URINE MICROSCOPIC-ADD ON     Status: Abnormal   Collection Time    02/26/14  7:38 PM      Result Value Ref Range   Squamous Epithelial / LPF MANY (*) RARE   WBC, UA 3-6  <3 WBC/hpf   RBC / HPF 0-2  <3 RBC/hpf   Bacteria, UA MANY (*) RARE  WET PREP, GENITAL     Status: Abnormal   Collection Time    02/26/14  8:20 PM      Result Value Ref Range   Yeast Wet Prep HPF POC NONE SEEN  NONE SEEN   Trich, Wet Prep NONE SEEN  NONE SEEN   Clue Cells Wet Prep HPF  POC NONE SEEN  NONE SEEN   WBC, Wet Prep HPF POC MODERATE (*) NONE SEEN    2135: D/W Dr. Dion Body, patient can have 2 percocet and a heating pad. If pain resolves may be DC home.  2230: Patient is sleeping comfortably. Will dc home  Assessment and Plan   1. Back pain complicating pregnancy in third trimester    Comfort measures reviewed PTL precautions Fetal kick counts Return to MAU as needed  Follow-up Information   Follow up with [name]. (As scheduled)    Specialty:  Obstetrics and Gynecology   Contact information:   [address] [place] [address] [phone]        [name] 02/26/2014, 8:24 PM

## 2014-02-26 NOTE — MAU Note (Signed)
Pt states she started feeling back pain Tuesday and it became worse Tuesday night.

## 2014-02-26 NOTE — MAU Note (Signed)
Low back pain, constant, since Tuesday, worse today. Denies vaginal bleeding/vaginal discharge/LOF. Positive fetal movement. Denies falls. Denies dysuria.

## 2014-02-27 LAB — URINE CULTURE

## 2014-03-31 ENCOUNTER — Ambulatory Visit: Payer: 59

## 2014-04-01 ENCOUNTER — Ambulatory Visit: Payer: 59 | Attending: Obstetrics and Gynecology | Admitting: Physical Therapy

## 2014-04-01 DIAGNOSIS — M545 Low back pain, unspecified: Secondary | ICD-10-CM | POA: Diagnosis present

## 2014-05-02 ENCOUNTER — Encounter (HOSPITAL_COMMUNITY): Payer: Self-pay | Admitting: Pharmacist

## 2014-05-15 NOTE — Patient Instructions (Addendum)
   Your procedure is scheduled on:  Monday, August 24  Enter through the Main Entrance of [facility] at:  750 AM Pick up the phone at the desk and dial [phone] and inform us of your arrival.  Please call this number if you have any problems the morning of surgery: [phone]  Remember: Do not eat or drink after midnight: Sunday Take these medicines the morning of surgery with a SIP OF WATER:  Valtrex  Do not wear jewelry, make-up, or FINGER nail polish No metal in your hair or on your body. Do not wear lotions, powders, perfumes.  You may wear deodorant.  Do not bring valuables to the hospital. Contacts, dentures or bridgework may not be worn into surgery.  Leave suitcase in the car. After Surgery it may be brought to your room. For patients being admitted to the hospital, checkout time is 11:00am the day of discharge.  Home with FOB Onalee Huaavid cell [phone]

## 2014-05-16 ENCOUNTER — Encounter (HOSPITAL_COMMUNITY): Payer: Self-pay

## 2014-05-16 ENCOUNTER — Encounter (HOSPITAL_COMMUNITY)
Admission: RE | Admit: 2014-05-16 | Discharge: 2014-05-16 | Disposition: A | Discharge status: Home / Self Care | Payer: 59 | Source: Ambulatory Visit | Attending: Obstetrics and Gynecology | Admitting: Obstetrics and Gynecology

## 2014-05-16 HISTORY — DX: Anxiety disorder, unspecified: F41.9

## 2014-05-16 HISTORY — DX: Dorsalgia, unspecified: M54.9

## 2014-05-16 HISTORY — DX: Other specified diseases and conditions complicating pregnancy, childbirth and the puerperium: O99.89

## 2014-05-16 HISTORY — DX: Other specified diseases and conditions complicating pregnancy: O99.891

## 2014-05-16 HISTORY — DX: Herpesviral infection, unspecified: B00.9

## 2014-05-16 LAB — CBC
HEMATOCRIT: 35.6 % — AB (ref 36.0–46.0)
Hemoglobin: 12.3 g/dL (ref 12.0–15.0)
MCH: 26.5 pg (ref 26.0–34.0)
MCHC: 34.6 g/dL (ref 30.0–36.0)
MCV: 76.7 fL — AB (ref 78.0–100.0)
Platelets: 213 10*3/uL (ref 150–400)
RBC: 4.64 MIL/uL (ref 3.87–5.11)
RDW: 14.4 % (ref 11.5–15.5)
WBC: 11.4 10*3/uL — ABNORMAL HIGH (ref 4.0–10.5)

## 2014-05-16 LAB — RPR

## 2014-05-16 LAB — ABO/RH: ABO/RH(D): B POS

## 2014-05-18 ENCOUNTER — Other Ambulatory Visit: Payer: Self-pay | Admitting: Obstetrics and Gynecology

## 2014-05-19 ENCOUNTER — Encounter (HOSPITAL_COMMUNITY): Payer: 59 | Admitting: Certified Registered"

## 2014-05-19 ENCOUNTER — Inpatient Hospital Stay (HOSPITAL_COMMUNITY): Payer: 59 | Admitting: Certified Registered"

## 2014-05-19 ENCOUNTER — Inpatient Hospital Stay (HOSPITAL_COMMUNITY)
Admission: RE | Admit: 2014-05-19 | Discharge: 2014-05-21 | DRG: 765 | Disposition: A | Discharge status: Home / Self Care | Payer: 59 | Source: Ambulatory Visit | Attending: Obstetrics and Gynecology | Admitting: Obstetrics and Gynecology

## 2014-05-19 ENCOUNTER — Encounter (HOSPITAL_COMMUNITY)
Admission: RE | Disposition: A | Discharge status: Home / Self Care | Payer: Self-pay | Source: Ambulatory Visit | Attending: Obstetrics and Gynecology

## 2014-05-19 ENCOUNTER — Encounter (HOSPITAL_COMMUNITY): Payer: Self-pay | Admitting: *Deleted

## 2014-05-19 DIAGNOSIS — Z87891 Personal history of nicotine dependence: Secondary | ICD-10-CM | POA: Diagnosis not present

## 2014-05-19 DIAGNOSIS — Z825 Family history of asthma and other chronic lower respiratory diseases: Secondary | ICD-10-CM | POA: Diagnosis not present

## 2014-05-19 DIAGNOSIS — Z302 Encounter for sterilization: Secondary | ICD-10-CM

## 2014-05-19 DIAGNOSIS — E669 Obesity, unspecified: Secondary | ICD-10-CM | POA: Diagnosis present

## 2014-05-19 DIAGNOSIS — Z6841 Body Mass Index (BMI) 40.0 and over, adult: Secondary | ICD-10-CM | POA: Diagnosis not present

## 2014-05-19 DIAGNOSIS — A6000 Herpesviral infection of urogenital system, unspecified: Secondary | ICD-10-CM | POA: Diagnosis present

## 2014-05-19 DIAGNOSIS — O99214 Obesity complicating childbirth: Secondary | ICD-10-CM

## 2014-05-19 DIAGNOSIS — O98519 Other viral diseases complicating pregnancy, unspecified trimester: Secondary | ICD-10-CM | POA: Diagnosis present

## 2014-05-19 DIAGNOSIS — Z91040 Latex allergy status: Secondary | ICD-10-CM | POA: Diagnosis not present

## 2014-05-19 DIAGNOSIS — Z823 Family history of stroke: Secondary | ICD-10-CM | POA: Diagnosis not present

## 2014-05-19 DIAGNOSIS — Z98891 History of uterine scar from previous surgery: Secondary | ICD-10-CM

## 2014-05-19 DIAGNOSIS — F411 Generalized anxiety disorder: Secondary | ICD-10-CM | POA: Diagnosis present

## 2014-05-19 DIAGNOSIS — O99344 Other mental disorders complicating childbirth: Secondary | ICD-10-CM | POA: Diagnosis present

## 2014-05-19 DIAGNOSIS — O34219 Maternal care for unspecified type scar from previous cesarean delivery: Secondary | ICD-10-CM | POA: Diagnosis present

## 2014-05-19 LAB — PREPARE RBC (CROSSMATCH)

## 2014-05-19 SURGERY — Surgical Case
Anesthesia: Spinal | Site: Abdomen | Laterality: Bilateral

## 2014-05-19 MED ORDER — SCOPOLAMINE 1 MG/3DAYS TD PT72
1.0000 | MEDICATED_PATCH | Freq: Once | TRANSDERMAL | Status: DC
  Administered 2014-05-19: 1.5 mg via TRANSDERMAL

## 2014-05-19 MED ORDER — IBUPROFEN 100 MG/5ML PO SUSP
600.0000 mg | Freq: Four times a day (QID) | ORAL | Status: DC
  Administered 2014-05-19 – 2014-05-21 (×7): 600 mg via ORAL
  Filled 2014-05-19 (×7): qty 30

## 2014-05-19 MED ORDER — PRENATAL MULTIVITAMIN CH
1.0000 | ORAL_TABLET | Freq: Every day | ORAL | Status: DC
  Filled 2014-05-19: qty 1

## 2014-05-19 MED ORDER — ONDANSETRON HCL 4 MG/2ML IJ SOLN
INTRAMUSCULAR | Status: DC | PRN
  Administered 2014-05-19: 4 mg via INTRAVENOUS

## 2014-05-19 MED ORDER — CEFAZOLIN SODIUM-DEXTROSE 2-3 GM-% IV SOLR: 2.0000 g | INTRAVENOUS | Status: DC

## 2014-05-19 MED ORDER — ONDANSETRON HCL 4 MG/2ML IJ SOLN
4.0000 mg | INTRAMUSCULAR | Status: DC | PRN
Start: 2014-05-19 — End: 2014-05-21

## 2014-05-19 MED ORDER — ONDANSETRON HCL 4 MG/2ML IJ SOLN
INTRAMUSCULAR | Status: AC
  Filled 2014-05-19: qty 2

## 2014-05-19 MED ORDER — METOCLOPRAMIDE HCL 5 MG/ML IJ SOLN: 10.0000 mg | Freq: Three times a day (TID) | INTRAMUSCULAR | Status: DC | PRN

## 2014-05-19 MED ORDER — FENTANYL CITRATE 0.05 MG/ML IJ SOLN
INTRAMUSCULAR | Status: DC | PRN
  Administered 2014-05-19: 75 ug via INTRAVENOUS
  Administered 2014-05-19: 100 ug via INTRAVENOUS
  Administered 2014-05-19: 25 ug via INTRATHECAL

## 2014-05-19 MED ORDER — NALBUPHINE HCL 10 MG/ML IJ SOLN: 5.0000 mg | INTRAMUSCULAR | Status: DC | PRN

## 2014-05-19 MED ORDER — MORPHINE SULFATE (PF) 0.5 MG/ML IJ SOLN
INTRAMUSCULAR | Status: DC | PRN
  Administered 2014-05-19 (×2): 1 mg via EPIDURAL
  Administered 2014-05-19: .15 mg via INTRATHECAL
  Administered 2014-05-19: .85 mg via INTRAVENOUS
  Administered 2014-05-19: 2 mg via INTRAVENOUS

## 2014-05-19 MED ORDER — SENNOSIDES-DOCUSATE SODIUM 8.6-50 MG PO TABS
2.0000 | ORAL_TABLET | ORAL | Status: DC
  Administered 2014-05-20 (×2): 2 via ORAL
  Filled 2014-05-19 (×2): qty 2

## 2014-05-19 MED ORDER — METHYLERGONOVINE MALEATE 0.2 MG PO TABS: 0.2000 mg | ORAL_TABLET | ORAL | Status: DC | PRN

## 2014-05-19 MED ORDER — MEPERIDINE HCL 25 MG/ML IJ SOLN
6.2500 mg | INTRAMUSCULAR | Status: DC | PRN
  Administered 2014-05-19: 6.25 mg via INTRAVENOUS

## 2014-05-19 MED ORDER — LACTATED RINGERS IV SOLN
INTRAVENOUS | Status: DC
  Administered 2014-05-19 (×2): via INTRAVENOUS

## 2014-05-19 MED ORDER — PHENYLEPHRINE 8 MG IN D5W 100 ML (0.08MG/ML) PREMIX OPTIME
INJECTION | INTRAVENOUS | Status: DC | PRN
  Administered 2014-05-19: 60 ug/min via INTRAVENOUS

## 2014-05-19 MED ORDER — SODIUM CHLORIDE 0.9 % IJ SOLN: 3.0000 mL | INTRAMUSCULAR | Status: DC | PRN

## 2014-05-19 MED ORDER — DIPHENHYDRAMINE HCL 25 MG PO CAPS
25.0000 mg | ORAL_CAPSULE | Freq: Four times a day (QID) | ORAL | Status: DC | PRN
  Administered 2014-05-19: 25 mg via ORAL
  Filled 2014-05-19: qty 1

## 2014-05-19 MED ORDER — FENTANYL CITRATE 0.05 MG/ML IJ SOLN
INTRAMUSCULAR | Status: AC
Start: 2014-05-19 — End: 2014-05-19
  Filled 2014-05-19: qty 2

## 2014-05-19 MED ORDER — OXYTOCIN 40 UNITS IN LACTATED RINGERS INFUSION - SIMPLE MED: 62.5000 mL/h | INTRAVENOUS | Status: AC

## 2014-05-19 MED ORDER — LACTATED RINGERS IV SOLN
Freq: Once | INTRAVENOUS | Status: AC
  Administered 2014-05-19: 08:00:00 via INTRAVENOUS

## 2014-05-19 MED ORDER — DIPHENHYDRAMINE HCL 50 MG/ML IJ SOLN
INTRAMUSCULAR | Status: AC
  Filled 2014-05-19: qty 1

## 2014-05-19 MED ORDER — NALOXONE HCL 1 MG/ML IJ SOLN
1.0000 ug/kg/h | INTRAVENOUS | Status: DC | PRN
  Filled 2014-05-19: qty 2

## 2014-05-19 MED ORDER — CEFAZOLIN SODIUM-DEXTROSE 2-3 GM-% IV SOLR
INTRAVENOUS | Status: AC
  Filled 2014-05-19: qty 50

## 2014-05-19 MED ORDER — CHLOROPROCAINE HCL 3 % IJ SOLN
INTRAMUSCULAR | Status: DC | PRN
  Administered 2014-05-19: 20 mL

## 2014-05-19 MED ORDER — ZOLPIDEM TARTRATE 5 MG PO TABS: 5.0000 mg | ORAL_TABLET | Freq: Every evening | ORAL | Status: DC | PRN

## 2014-05-19 MED ORDER — SCOPOLAMINE 1 MG/3DAYS TD PT72
MEDICATED_PATCH | TRANSDERMAL | Status: DC
Start: 2014-05-19 — End: 2014-05-21
  Administered 2014-05-19: 1.5 mg via TRANSDERMAL
  Filled 2014-05-19: qty 1

## 2014-05-19 MED ORDER — CHLOROPROCAINE HCL 3 % IJ SOLN
INTRAMUSCULAR | Status: AC
  Filled 2014-05-19: qty 20

## 2014-05-19 MED ORDER — KETOROLAC TROMETHAMINE 30 MG/ML IJ SOLN: 30.0000 mg | Freq: Four times a day (QID) | INTRAMUSCULAR | Status: DC | PRN

## 2014-05-19 MED ORDER — FERROUS SULFATE 325 (65 FE) MG PO TABS
325.0000 mg | ORAL_TABLET | Freq: Two times a day (BID) | ORAL | Status: DC
  Administered 2014-05-20 – 2014-05-21 (×3): 325 mg via ORAL
  Filled 2014-05-19 (×3): qty 1

## 2014-05-19 MED ORDER — SIMETHICONE 80 MG PO CHEW
80.0000 mg | CHEWABLE_TABLET | ORAL | Status: DC
  Administered 2014-05-20 (×2): 80 mg via ORAL
  Filled 2014-05-19 (×2): qty 1

## 2014-05-19 MED ORDER — METHYLERGONOVINE MALEATE 0.2 MG/ML IJ SOLN: 0.2000 mg | INTRAMUSCULAR | Status: DC | PRN

## 2014-05-19 MED ORDER — MIDAZOLAM HCL 2 MG/2ML IJ SOLN
INTRAMUSCULAR | Status: AC
  Filled 2014-05-19: qty 2

## 2014-05-19 MED ORDER — DIBUCAINE 1 % RE OINT: 1.0000 "application " | TOPICAL_OINTMENT | RECTAL | Status: DC | PRN

## 2014-05-19 MED ORDER — SIMETHICONE 80 MG PO CHEW: 80.0000 mg | CHEWABLE_TABLET | ORAL | Status: DC | PRN

## 2014-05-19 MED ORDER — OXYTOCIN 10 UNIT/ML IJ SOLN
40.0000 [IU] | INTRAVENOUS | Status: DC | PRN
  Administered 2014-05-19: 40 [IU] via INTRAVENOUS

## 2014-05-19 MED ORDER — HYDROMORPHONE HCL PF 1 MG/ML IJ SOLN: 0.2500 mg | INTRAMUSCULAR | Status: DC | PRN

## 2014-05-19 MED ORDER — PHENYLEPHRINE 8 MG IN D5W 100 ML (0.08MG/ML) PREMIX OPTIME
INJECTION | INTRAVENOUS | Status: AC
  Filled 2014-05-19: qty 100

## 2014-05-19 MED ORDER — OXYTOCIN 10 UNIT/ML IJ SOLN
INTRAMUSCULAR | Status: AC
  Filled 2014-05-19: qty 4

## 2014-05-19 MED ORDER — KETOROLAC TROMETHAMINE 30 MG/ML IJ SOLN
30.0000 mg | Freq: Four times a day (QID) | INTRAMUSCULAR | Status: DC | PRN
  Administered 2014-05-19: 30 mg via INTRAVENOUS

## 2014-05-19 MED ORDER — MIDAZOLAM HCL 2 MG/2ML IJ SOLN
INTRAMUSCULAR | Status: DC | PRN
  Administered 2014-05-19: 1 mg via INTRAVENOUS

## 2014-05-19 MED ORDER — SCOPOLAMINE 1 MG/3DAYS TD PT72: 1.0000 | MEDICATED_PATCH | Freq: Once | TRANSDERMAL | Status: DC

## 2014-05-19 MED ORDER — DIPHENHYDRAMINE HCL 50 MG/ML IJ SOLN: 25.0000 mg | INTRAMUSCULAR | Status: DC | PRN

## 2014-05-19 MED ORDER — SIMETHICONE 80 MG PO CHEW
80.0000 mg | CHEWABLE_TABLET | Freq: Three times a day (TID) | ORAL | Status: DC
  Administered 2014-05-20 (×3): 80 mg via ORAL
  Filled 2014-05-19 (×4): qty 1

## 2014-05-19 MED ORDER — WITCH HAZEL-GLYCERIN EX PADS: 1.0000 "application " | MEDICATED_PAD | CUTANEOUS | Status: DC | PRN

## 2014-05-19 MED ORDER — ONDANSETRON HCL 4 MG PO TABS: 4.0000 mg | ORAL_TABLET | ORAL | Status: DC | PRN

## 2014-05-19 MED ORDER — MEPERIDINE HCL 25 MG/ML IJ SOLN
INTRAMUSCULAR | Status: AC
  Filled 2014-05-19: qty 1

## 2014-05-19 MED ORDER — PRENATAL MULTIVITAMIN CH
1.0000 | ORAL_TABLET | Freq: Every day | ORAL | Status: DC
  Administered 2014-05-20 – 2014-05-21 (×2): 1 via ORAL
  Filled 2014-05-19: qty 1

## 2014-05-19 MED ORDER — NALOXONE HCL 0.4 MG/ML IJ SOLN: 0.4000 mg | INTRAMUSCULAR | Status: DC | PRN

## 2014-05-19 MED ORDER — ONDANSETRON HCL 4 MG/2ML IJ SOLN: 4.0000 mg | Freq: Three times a day (TID) | INTRAMUSCULAR | Status: DC | PRN

## 2014-05-19 MED ORDER — IBUPROFEN 600 MG PO TABS: 600.0000 mg | ORAL_TABLET | Freq: Four times a day (QID) | ORAL | Status: DC

## 2014-05-19 MED ORDER — LACTATED RINGERS IV SOLN
INTRAVENOUS | Status: DC
  Administered 2014-05-19 (×3): via INTRAVENOUS

## 2014-05-19 MED ORDER — KETOROLAC TROMETHAMINE 30 MG/ML IJ SOLN
INTRAMUSCULAR | Status: AC
  Filled 2014-05-19: qty 1

## 2014-05-19 MED ORDER — MORPHINE SULFATE 0.5 MG/ML IJ SOLN
INTRAMUSCULAR | Status: AC
  Filled 2014-05-19: qty 10

## 2014-05-19 MED ORDER — DIPHENHYDRAMINE HCL 50 MG/ML IJ SOLN
12.5000 mg | INTRAMUSCULAR | Status: DC | PRN
  Administered 2014-05-19: 12.5 mg via INTRAVENOUS

## 2014-05-19 MED ORDER — LANOLIN HYDROUS EX OINT: 1.0000 "application " | TOPICAL_OINTMENT | CUTANEOUS | Status: DC | PRN

## 2014-05-19 MED ORDER — FENTANYL CITRATE 0.05 MG/ML IJ SOLN
INTRAMUSCULAR | Status: AC
  Filled 2014-05-19: qty 2

## 2014-05-19 MED ORDER — OXYCODONE-ACETAMINOPHEN 5-325 MG PO TABS: 1.0000 | ORAL_TABLET | ORAL | Status: DC | PRN

## 2014-05-19 MED ORDER — NALBUPHINE HCL 10 MG/ML IJ SOLN
10.0000 mg | Freq: Four times a day (QID) | INTRAMUSCULAR | Status: DC | PRN
  Administered 2014-05-19: 10 mg via INTRAVENOUS
  Filled 2014-05-19: qty 1

## 2014-05-19 MED ORDER — DIPHENHYDRAMINE HCL 25 MG PO CAPS
25.0000 mg | ORAL_CAPSULE | ORAL | Status: DC | PRN
  Filled 2014-05-19: qty 1

## 2014-05-19 MED ORDER — MENTHOL 3 MG MT LOZG: 1.0000 | LOZENGE | OROMUCOSAL | Status: DC | PRN

## 2014-05-19 SURGICAL SUPPLY — 39 items
BARRIER ADHS 3X4 INTERCEED (GAUZE/BANDAGES/DRESSINGS) IMPLANT
BENZOIN TINCTURE PRP APPL 2/3 (GAUZE/BANDAGES/DRESSINGS) ×3 IMPLANT
BLADE SURG 10 STRL SS (BLADE) ×6 IMPLANT
CLAMP CORD UMBIL (MISCELLANEOUS) IMPLANT
CLOSURE WOUND 1/2 X4 (GAUZE/BANDAGES/DRESSINGS) ×1
CLOTH BEACON ORANGE TIMEOUT ST (SAFETY) ×3 IMPLANT
CONTAINER PREFILL 10% NBF 15ML (MISCELLANEOUS) IMPLANT
DRAPE LG THREE QUARTER DISP (DRAPES) ×3 IMPLANT
DRSG OPSITE POSTOP 4X10 (GAUZE/BANDAGES/DRESSINGS) ×3 IMPLANT
DURAPREP 26ML APPLICATOR (WOUND CARE) ×3 IMPLANT
ELECT REM PT RETURN 9FT ADLT (ELECTROSURGICAL) ×3
ELECTRODE REM PT RTRN 9FT ADLT (ELECTROSURGICAL) ×1 IMPLANT
EXTRACTOR VACUUM M CUP 4 TUBE (SUCTIONS) IMPLANT
EXTRACTOR VACUUM M CUP 4' TUBE (SUCTIONS)
GLOVE BIOGEL M 6.5 STRL (GLOVE) ×6 IMPLANT
GLOVE BIOGEL PI IND STRL 6.5 (GLOVE) ×1 IMPLANT
GLOVE BIOGEL PI INDICATOR 6.5 (GLOVE) ×2
GOWN STRL REUS W/TWL LRG LVL3 (GOWN DISPOSABLE) ×9 IMPLANT
KIT ABG SYR 3ML LUER SLIP (SYRINGE) IMPLANT
NEEDLE HYPO 25X5/8 SAFETYGLIDE (NEEDLE) IMPLANT
NS IRRIG 1000ML POUR BTL (IV SOLUTION) ×3 IMPLANT
PACK C SECTION WH (CUSTOM PROCEDURE TRAY) ×3 IMPLANT
PAD OB MATERNITY 4.3X12.25 (PERSONAL CARE ITEMS) ×3 IMPLANT
RTRCTR C-SECT PINK 25CM LRG (MISCELLANEOUS) ×3 IMPLANT
RTRCTR C-SECT PINK 34CM XLRG (MISCELLANEOUS) IMPLANT
STAPLER VISISTAT 35W (STAPLE) IMPLANT
STRIP CLOSURE SKIN 1/2X4 (GAUZE/BANDAGES/DRESSINGS) ×2 IMPLANT
SUT PDS AB 0 CT1 27 (SUTURE) ×6 IMPLANT
SUT PLAIN 0 NONE (SUTURE) IMPLANT
SUT VIC AB 0 CTX 36 (SUTURE) ×6
SUT VIC AB 0 CTX36XBRD ANBCTRL (SUTURE) ×3 IMPLANT
SUT VIC AB 2-0 CT1 27 (SUTURE) ×2
SUT VIC AB 2-0 CT1 TAPERPNT 27 (SUTURE) ×1 IMPLANT
SUT VIC AB 3-0 SH 27 (SUTURE)
SUT VIC AB 3-0 SH 27X BRD (SUTURE) IMPLANT
SUT VIC AB 4-0 KS 27 (SUTURE) ×3 IMPLANT
TOWEL OR 17X24 6PK STRL BLUE (TOWEL DISPOSABLE) ×3 IMPLANT
TRAY FOLEY CATH 14FR (SET/KITS/TRAYS/PACK) ×3 IMPLANT
WATER STERILE IRR 1000ML POUR (IV SOLUTION) ×3 IMPLANT

## 2014-05-19 NOTE — Anesthesia Procedure Notes (Signed)

## 2014-05-19 NOTE — Lactation Note (Signed)
This note was copied from the chart of Joanne Gwendelyn Lanting. Lactation Consultation Note  Patient Name: Joanne Ellis NWGNF'A Date: 05/19/2014 Reason for consult: Initial assessment of this mother/baby dyad at 13 hours postpartum.  Mom was recently up to bathroom and LC unable to visit, now she is ready to go to sleep. Mom has almost 24 yo child but states she tried breastfeeding and that child would not latch.  Mom reports her newborn is latching well.  Baby has had three successful breastfeedings for 20-45 minutes each and LATCH score=8 per RN assessment.  Mom says she has been able to express some of her colostrum.  LC encouraged STS and cue feedings.  LC encouraged review of Baby and Me pp 9, 14 and 20-25 for STS and BF information. LC provided [employer] Resource brochure and reviewed [facility] services and list of community and web site resources.    Maternal Data Formula Feeding for Exclusion: No Has patient been taught Hand Expression?:  (not yet documented) Does the patient have breastfeeding experience prior to this delivery?: No (attempted but her first baby would not latch)  Feeding Feeding Type: Breast Fed Length of feed: 25 min  LATCH Score/Interventions           LATCH score=8 per RN assessment           Lactation Tools Discussed/Used    STS, cue feedings, hand expression  Consult Status Consult Status: Follow-up Date: 05/20/14 Follow-up type: In-patient    Warrick Parisian [facility] 05/19/2014, 11:10 PM

## 2014-05-19 NOTE — Anesthesia Postprocedure Evaluation (Signed)
  Anesthesia Post-op Note  Patient: Joanne Ellis  Procedure(s) Performed: Procedure(s): REPEAT CESAREAN SECTION WITH BILATERAL TUBAL LIGATION (Bilateral)  Patient is awake, responsive, moving her legs, and has signs of resolution of her numbness. Pain and nausea are reasonably well controlled. Vital signs are stable and clinically acceptable. Oxygen saturation is clinically acceptable. There are no apparent anesthetic complications at this time. Patient is ready for discharge.

## 2014-05-19 NOTE — H&P (Addendum)
Joanne Ellis is a 23 y.o. female presenting for  Repeat cesarean section at 39 wks and 2 days. Pregnancy complicated by h/o cesarean section,. Pt desires repeat and bilateral tubal ligation . History OB History   Grav Para Term Preterm Abortions TAB SAB Ect Mult Living   Past Medical History  Diagnosis Date  . HSV infection   . Anxiety     no meds  . Back pain during pregnancy     mid to lower back   Past Surgical History  Procedure Laterality Date  . Cesarean section  05/22/2011    Procedure: CESAREAN SECTION;  Surgeon: Kathreen Cosier, MD;  Location: [facility];  Service: Gynecology;  Laterality: N/A;   Family History: family history includes Alcohol abuse in her maternal grandfather, maternal grandmother, and paternal grandfather; Asthma in her mother; Miscarriages / Stillbirths in her mother; Stroke in her paternal grandfather. Social History:  reports that she quit smoking about 8 months ago. Her smoking use included Cigarettes. She has a .5 pack-year smoking history. She has never used smokeless tobacco. She reports that she does not drink alcohol or use illicit drugs.   Allergies IODINE/Shellfish/ Latex  Prenatal Transfer Tool  Maternal Diabetes: No Genetic Screening: Normal Maternal Ultrasounds/Referrals: Normal Fetal Ultrasounds or other Referrals:  None Maternal Substance Abuse:  No Significant Maternal Medications:  Meds include: Other:  Valtrex  Significant Maternal Lab Results:  None Other Comments:  None  Review of Systems  All other systems reviewed and are negative.     Blood pressure 112/73, pulse 82, temperature 98.2 F (36.8 C), temperature source Oral, resp. rate 18, last menstrual period 08/07/2013, SpO2 99.00%. Maternal Exam:  Abdomen: Surgical scars: low transverse.   Fetal presentation: vertex  Introitus: Normal vulva. Normal vagina.    Physical Exam  Vitals reviewed. Constitutional: She is oriented to person, place, and time.  She appears well-developed and well-nourished.  HENT:  Head: Normocephalic and atraumatic.  Neck: Normal range of motion.  Cardiovascular: Normal rate and regular rhythm.   Respiratory: Effort normal.  GI: She exhibits distension. There is no tenderness.  Musculoskeletal: Normal range of motion.  Neurological: She is alert and oriented to person, place, and time.  Skin: Skin is warm and dry.  Psychiatric: She has a normal mood and affect.    Prenatal labs: ABO, Rh: --/--/B POS, B POS (08/21 1445) Antibody: NEG (08/21 1445) Rubella:   RPR: NON REAC (08/21 1445)  HBsAg:   Negative  HIV: Non-reactive (01/27 0000)  GBS:  Negative    Assessment/Plan: 39 wks and 2 days for repeat cesarean section and bilateral tubal ligation. R/B/A of the surgery were discussed with the patient including but not limited to infection/ bleeding damage to bowel bladder baby and surrounding organs with the need for further surgery. R/O transfusion HIV/ Hep B&C discussed. Pt voiced understanding and desires to proceed.    Lilton Pare J. 05/19/2014, 9:08 AM

## 2014-05-19 NOTE — Addendum Note (Signed)
Addendum created 05/19/14 1600 by Algis Greenhouse, CRNA   Modules edited: Notes Section   Notes Section:  File: [id number]

## 2014-05-19 NOTE — Op Note (Signed)
Cesarean Section Procedure Note  Indications: 39 wks and 2 days with  H/o cesarean section desires repeat and bilateral tubal ligation   Pre-operative Diagnosis: 39 week 2 day pregnancy.  Post-operative Diagnosis: same  Surgeon: Jessee Avers.   Assistants: Dr. Geryl Rankins   Anesthesia: Spinal anesthesia  ASA Class: 2   Procedure Details   The patient was seen in the Holding Room. The risks, benefits, complications, treatment options, and expected outcomes were discussed with the patient.  The patient concurred with the proposed plan, giving informed consent.  The site of surgery properly noted/marked. The patient was taken to Operating Room # 9, identified as Joanne Ellis and the procedure verified as C-Section Delivery. A Time Out was held and the above information confirmed.  After induction of anesthesia, the patient was draped and prepped in the usual sterile manner. A Pfannenstiel incision was made and carried down through the subcutaneous tissue to the fascia. Fascial incision was made and extended transversely. The fascia was separated from the underlying rectus tissue superiorly and inferiorly. The peritoneum was identified and entered. Peritoneal incision was extended longitudinally. The utero-vesical peritoneal reflection was incised transversely and the bladder flap was bluntly freed from the lower uterine segment. A low transverse uterine incision was made. Delivered from cephalic presentation was a Female with Apgar scores of 8 at one minute and 9 at five minutes. After the umbilical cord was clamped and cut cord blood was obtained for evaluation. The placenta was removed intact and appeared normal. The uterine outline, tubes and ovaries appeared normal. The uterine incision was closed with running locked sutures of 0 vicryl .  Hemostasis was observed. The right fallopian tube was grasped with a babcock clamp. A windo was made in the mesosalpinx. The medial and lateral portion of  the  Fallopian tube was suture ligated. The intervening section was excised and sent to pathology. This was repeated on the left fallopian tube.  Lavage was carried out until clear. The fascia was then reapproximated with running sutures of 0 pds. The skin was reapproximated with 4-0 vicryl. .  Instrument, sponge, and needle counts were correct prior the abdominal closure and at the conclusion of the case.   Findings: Female infant in the  Cephalic presentation.    Estimated Blood Loss:  800 mL         Drains: Foley Catheter          Total IV Fluids:  2750 ml         Specimens: Fallopian Tube and Disposition:  Sent to Pathology          Implants: None          Complications:  None; patient tolerated the procedure well.         Disposition: PACU - hemodynamically stable.         Condition: stable  Attending Attestation: I performed the procedure.

## 2014-05-19 NOTE — Transfer of Care (Signed)
Immediate Anesthesia Transfer of Care Note  Patient: Joanne Ellis  Procedure(s) Performed: Procedure(s): REPEAT CESAREAN SECTION WITH BILATERAL TUBAL LIGATION (Bilateral)  Patient Location: PACU  Anesthesia Type:Spinal  Level of Consciousness: awake, alert  and oriented  Airway & Oxygen Therapy: Patient Spontanous Breathing  Post-op Assessment: Report given to PACU RN and Post -op Vital signs reviewed and stable  Post vital signs: Reviewed and stable  Complications: No apparent anesthesia complications

## 2014-05-19 NOTE — Progress Notes (Signed)
Patient is drowsy and requests to wait to ambulate due to not eating yet and feeling drowsy.

## 2014-05-19 NOTE — Anesthesia Postprocedure Evaluation (Signed)
Anesthesia Post Note  Patient: Joanne Ellis  Procedure(s) Performed: Procedure(s) (LRB): REPEAT CESAREAN SECTION WITH BILATERAL TUBAL LIGATION (Bilateral)  Anesthesia type: Spinal  Patient location: Mother/Baby  Post pain: Pain level controlled  Post assessment: Post-op Vital signs reviewed  Last Vitals:  Filed Vitals:   05/19/14 1345  BP:   Pulse: 68  Temp:   Resp: 16    Post vital signs: Reviewed  Level of consciousness: awake  Complications: No apparent anesthesia complications

## 2014-05-19 NOTE — Anesthesia Preprocedure Evaluation (Signed)
Anesthesia Evaluation  Patient identified by MRN, date of birth, ID band Patient awake    Reviewed: Allergy & Precautions, H&P , Patient's Chart, lab work & pertinent test results  Airway Mallampati: II TM Distance: >3 FB Neck ROM: full    Dental no notable dental hx.    Pulmonary former smoker,  breath sounds clear to auscultation  Pulmonary exam normal       Cardiovascular Exercise Tolerance: Good Rhythm:regular Rate:Normal     Neuro/Psych    GI/Hepatic   Endo/Other  Morbid obesity  Renal/GU      Musculoskeletal   Abdominal   Peds  Hematology   Anesthesia Other Findings   Reproductive/Obstetrics                          Anesthesia Physical Anesthesia Plan  ASA: III  Anesthesia Plan: Spinal   Post-op Pain Management:    Induction:   Airway Management Planned:   Additional Equipment:   Intra-op Plan:   Post-operative Plan:   Informed Consent: I have reviewed the patients History and Physical, chart, labs and discussed the procedure including the risks, benefits and alternatives for the proposed anesthesia with the patient or authorized representative who has indicated his/her understanding and acceptance.   Dental Advisory Given  Plan Discussed with: CRNA  Anesthesia Plan Comments: (Lab work confirmed with CRNA in room. Platelets okay. Discussed spinal anesthetic, and patient consents to the procedure:  included risk of possible headache,backache, failed block, allergic reaction, and nerve injury. This patient was asked if she had any questions or concerns before the procedure started. )        Anesthesia Quick Evaluation  

## 2014-05-20 ENCOUNTER — Encounter (HOSPITAL_COMMUNITY): Payer: Self-pay | Admitting: Obstetrics and Gynecology

## 2014-05-20 LAB — CBC
HEMATOCRIT: 26.8 % — AB (ref 36.0–46.0)
HEMOGLOBIN: 9.1 g/dL — AB (ref 12.0–15.0)
MCH: 26.5 pg (ref 26.0–34.0)
MCHC: 34 g/dL (ref 30.0–36.0)
MCV: 77.9 fL — ABNORMAL LOW (ref 78.0–100.0)
Platelets: 152 10*3/uL (ref 150–400)
RBC: 3.44 MIL/uL — ABNORMAL LOW (ref 3.87–5.11)
RDW: 14.6 % (ref 11.5–15.5)
WBC: 10 10*3/uL (ref 4.0–10.5)

## 2014-05-20 LAB — TYPE AND SCREEN
ABO/RH(D): B POS
Antibody Screen: NEGATIVE
UNIT DIVISION: 0
Unit division: 0

## 2014-05-20 LAB — BIRTH TISSUE RECOVERY COLLECTION (PLACENTA DONATION)

## 2014-05-20 NOTE — Progress Notes (Signed)
Encouraged patient to ambulate in the hallway this AM.  Patient instructed to void into the plastic container in the toilet so we may measure her urine.

## 2014-05-20 NOTE — Progress Notes (Signed)
Clinical Social Work Department PSYCHOSOCIAL ASSESSMENT - MATERNAL/CHILD 05/20/2014  Patient:  Joanne Ellis,Joanne Ellis  Account Number:  [id number]  Admit Date:  05/19/2014  Childs Name:   Joanne Ellis    Clinical Social Worker:  Kenidy Crossland, CLINICAL SOCIAL WORKER   Date/Time:  05/20/2014 11:00 AM  Date Referred:  05/19/2014   Referral source  Central Nursery     Referred reason  Depression/Anxiety   Other referral source:    I:  FAMILY / HOME ENVIRONMENT Child's legal guardian:  PARENT  Guardian - Name Guardian - Age Guardian - Address  Joanne Ellis 23 [address], [place], [place] [address]  Joanne Ellis  in separate residence   Other household support members/support persons Name Relationship DOB  Joanne Ellis SON 3 years old   Other support:   MOB shared that she has supportive family and friends. She stated that she and the FOB are not in a relationship, but discussed that they will "co-parent".    II  PSYCHOSOCIAL DATA Information Source:  Patient Interview  Financial and Community Resources Employment:   MOB shared that she is fully employed.  She stated that she will transition back to work by working from home before returning to job full time out of the home.   Financial resources:  Private Insurance If Medicaid - County:  [place] Other  WIC  Food Stamps   School / Grade:  N/A Maternity Care Coordinator / Child Services Coordination / Early Interventions:   N/A  Cultural issues impacting care:   None reported    III  STRENGTHS Strengths  Adequate Resources  Home prepared for Child (including basic supplies)  Supportive family/friends   Strength comment:    IV  RISK FACTORS AND CURRENT PROBLEMS Current Problem:  YES   Risk Factor & Current Problem Patient Issue Family Issue Risk Factor / Current Problem Comment  Mental Illness Y N MOB reported that she experienced 5-6 panic attacks during her pregnancy.  She denied history of anxiety prior to  pregnancy.    V  SOCIAL WORK ASSESSMENT CSW met with MOB in her room to complete assessment. Consult ordered due to MOB history of anxiety.  MOB presented as receptive to completing the assessment, but maintained emotional distance from CSW as evidenced being vague as she discussed stressors during her pregnancy.  MOB's mood and affect appropriate to setting, no acute anxiety noted.  MOB receptive to learning emotional regulation strategies to control anxiety and receptive to seeking out professional help if needed in the future to control anxiety.  FOB arrived late during the assessment.  FOB participated minimally, but was observed to be attentive to the baby as he was holding her and attempting to bond with her throughout.    Per MOB, home is prepared for her newborn and stated that she is well supported by her family.  MOB shared that she and the FOB will be co-parenting as they are no longer in a romantic relationship.  She was vague in the details of their break-up but shared that it occurred during her pregnancy and that it was a good decision.  MOB denied any ongoing stressors related to the separation.  MOB briefly mentioned "other stressors" that occurred early in her pregnancy, but she was guarded about the nature of the stressors when CSW attempted to further process them.    MOB denied history of postpartum depression during previous postpartum period, but was receptive to education about post partum depression and anxiety.  MOB denied   mental health history prior to her pregnancy, but shared that she had 5-6 panic attacks during her pregnancy. She reviewed symptoms that included difficulties breathing and chest pain.  MOB shared that she is not sure what caused the panic attacks and presented with minimal insight on potential thoughts that she had prior that may contributed to the attacks.  MOB shared that the MD offered to prescribe her medication to address the panic attacks, but shared that  she did not want any medication at the time.  MOB shared that she is receptive to beginning medication during postpartum period if panic attacks were to continue, and is aware that there are numerous medications that are safe to take while breastfeeding.  CSW briefly discussed emotional regulation techniques to utilize when she notes the onset of symptoms, and she thanked CSW for assistance.   MOB aware of ongoing CSW availability if needed during admission.  No barriers to discharge.     VI SOCIAL WORK PLAN Social Work Therapist, art  No Further Intervention Required / No Barriers to Discharge   Type of pt/family education:   Post-partum depression and anxiety   If child protective services report - county:   If child protective services report - date:   Information/referral to community resources comment:   N/A   Other social work plan:   Ongoing emotional support as needed

## 2014-05-20 NOTE — Progress Notes (Signed)
Patient walking in hallway.

## 2014-05-20 NOTE — Progress Notes (Signed)
Subjective: Postpartum Day 1: Cesarean Delivery Patient reports tolerating PO, + flatus and no problems voiding.    Objective: Vital signs in last 24 hours: Temp:  [98 F (36.7 C)-98.6 F (37 C)] 98.1 F (36.7 C) (08/25 0500) Pulse Rate:  [68-104] 74 (08/25 0500) Resp:  [16-18] 18 (08/25 0500) BP: (93-112)/(46-62) 103/46 mmHg (08/25 0500) SpO2:  [98 %-100 %] 99 % (08/25 0500)  Physical Exam:  General: alert and cooperative Lochia: appropriate Uterine Fundus: firm Incision: healing well DVT Evaluation: No evidence of DVT seen on physical exam.   Recent Labs  05/20/14 0535  HGB 9.1*  HCT 26.8*    Assessment/Plan: Status post Cesarean section. Doing well postoperatively.  Continue current care.  Sekai Nayak J. 05/20/2014, 1:50 PM

## 2014-05-21 MED ORDER — IBUPROFEN 100 MG/5ML PO SUSP: 600.0000 mg | Freq: Four times a day (QID) | ORAL | Status: DC

## 2014-05-21 MED ORDER — OXYCODONE-ACETAMINOPHEN 5-325 MG PO TABS: 1.0000 | ORAL_TABLET | Freq: Four times a day (QID) | ORAL | Status: DC | PRN

## 2014-05-21 MED ORDER — TETANUS-DIPHTH-ACELL PERTUSSIS 5-2.5-18.5 LF-MCG/0.5 IM SUSP
0.5000 mL | Freq: Once | INTRAMUSCULAR | Status: AC
  Administered 2014-05-21: 0.5 mL via INTRAMUSCULAR
  Filled 2014-05-21: qty 0.5

## 2014-05-21 NOTE — Discharge Summary (Signed)
Obstetric Discharge Summary Reason for Admission: cesarean section Prenatal Procedures: none Intrapartum Procedures: cesarean: low cervical, transverse and tubal ligation Postpartum Procedures: none Complications-Operative and Postpartum: none Hemoglobin  Date Value Ref Range Status  05/20/2014 9.1* 12.0 - 15.0 g/dL Final     HCT  Date Value Ref Range Status  05/20/2014 26.8* 36.0 - 46.0 % Final    Physical Exam:  General: alert and cooperative Lochia: appropriate Uterine Fundus: firm Incision: healing well DVT Evaluation: No evidence of DVT seen on physical exam.  Discharge Diagnoses: Term Pregnancy-delivered  Discharge Information: Date: 05/21/2014 Activity: pelvic rest Diet: routine Medications: PNV, Ibuprofen and Percocet Condition: stable Instructions: refer to practice specific booklet Discharge to: home Follow-up Information   Follow up with Jessee Avers., MD. Schedule an appointment as soon as possible for a visit in 2 weeks. (incision check )    Specialty:  Obstetrics and Gynecology   Contact information:   [address] [place] [address] [phone]       Newborn Data: Live born female  Birth Weight: 6 lb 2.6 oz (2795 g) APGAR: 8, 9  Home with mother.  Margurite Duffy J. 05/21/2014, 8:34 AM

## 2014-05-21 NOTE — Lactation Note (Signed)
This note was copied from the chart of Joanne Ellis. Lactation Consultation Note  Patient Name: Joanne Ellis [id number] Date: 05/21/2014 Reason for consult: Follow-up assessment;Infant < 6lbs Mom reports baby is latching well and she is hearing some swallows. She is concerned that baby is cluster feeding. Basic teaching reviewed with Mom. She did not BF her 1st child, so reviewed with Mom that BF babies should be at the breast 8-12 times in 24 hours for 15-20 minutes each feeding. Mom reports she had some nipple tenderness but this is improving with using comfort gels. Mom declined to latch baby at this visit, baby recently fed and Mom is getting ready to go home. Engorgement care reviewed if needed. Advised of OP services and support group. Encouraged to monitor voids/stools.   Maternal Data    Feeding Length of feed: 30 min  LATCH Score/Interventions                      Lactation Tools Discussed/Used Tools: Pump Breast pump type: Manual   Consult Status Consult Status: Complete Date: 05/21/14 Follow-up type: In-patient    Alfred Levins 05/21/2014, 12:41 PM

## 2014-05-27 ENCOUNTER — Encounter (HOSPITAL_COMMUNITY): Payer: Self-pay | Admitting: *Deleted

## 2014-06-07 ENCOUNTER — Encounter (HOSPITAL_COMMUNITY): Payer: Self-pay

## 2014-06-07 ENCOUNTER — Inpatient Hospital Stay (HOSPITAL_COMMUNITY)
Admission: AD | Admit: 2014-06-07 | Discharge: 2014-06-07 | Disposition: A | Discharge status: Home / Self Care | Payer: 59 | Source: Ambulatory Visit | Attending: Obstetrics and Gynecology | Admitting: Obstetrics and Gynecology

## 2014-06-07 DIAGNOSIS — Z87891 Personal history of nicotine dependence: Secondary | ICD-10-CM | POA: Diagnosis not present

## 2014-06-07 DIAGNOSIS — O99893 Other specified diseases and conditions complicating puerperium: Secondary | ICD-10-CM | POA: Diagnosis not present

## 2014-06-07 DIAGNOSIS — K5900 Constipation, unspecified: Secondary | ICD-10-CM | POA: Insufficient documentation

## 2014-06-07 DIAGNOSIS — O2690 Pregnancy related conditions, unspecified, unspecified trimester: Secondary | ICD-10-CM

## 2014-06-07 DIAGNOSIS — O9989 Other specified diseases and conditions complicating pregnancy, childbirth and the puerperium: Principal | ICD-10-CM

## 2014-06-07 LAB — URINALYSIS, ROUTINE W REFLEX MICROSCOPIC
BILIRUBIN URINE: NEGATIVE
GLUCOSE, UA: NEGATIVE mg/dL
Ketones, ur: NEGATIVE mg/dL
Nitrite: NEGATIVE
PH: 5.5 (ref 5.0–8.0)
Protein, ur: NEGATIVE mg/dL
SPECIFIC GRAVITY, URINE: 1.015 (ref 1.005–1.030)
Urobilinogen, UA: 0.2 mg/dL (ref 0.0–1.0)

## 2014-06-07 LAB — URINE MICROSCOPIC-ADD ON

## 2014-06-07 LAB — POCT PREGNANCY, URINE: PREG TEST UR: NEGATIVE

## 2014-06-07 MED ORDER — LIDOCAINE HCL 2 % EX GEL
Freq: Once | CUTANEOUS | Status: AC
  Administered 2014-06-07: 08:00:00 via TOPICAL
  Filled 2014-06-07: qty 5

## 2014-06-07 MED ORDER — FLEET ENEMA 7-19 GM/118ML RE ENEM
1.0000 | ENEMA | Freq: Once | RECTAL | Status: AC
  Administered 2014-06-07: 1 via RECTAL

## 2014-06-07 MED ORDER — GLYCERIN (LAXATIVE) 2 G RE SUPP: 1.0000 | Freq: Once | RECTAL | Status: DC

## 2014-06-07 NOTE — MAU Note (Addendum)
Soap suds enema given with poor results. Pt states that she can't hold fluid from enema in because it hurts to bad

## 2014-06-07 NOTE — MAU Note (Signed)
Pt given fleets enema with minimal results.

## 2014-06-07 NOTE — MAU Note (Signed)
Lidocaine jelly to rectum.

## 2014-06-07 NOTE — Discharge Instructions (Signed)

## 2014-06-07 NOTE — MAU Note (Signed)
Pt in bathroom great result from enema

## 2014-06-07 NOTE — MAU Note (Signed)
Pt having constipation since about 430pm. Pt having small, hard bowel movements with little to no relief. Had repeat c-section on 8/24.

## 2014-06-07 NOTE — MAU Provider Note (Signed)
Chief Complaint: Constipation  First Provider Initiated Contact with Patient 06/07/14 0749      SUBJECTIVE HPI: Joanne Ellis is a 23 y.o. G2P2002 at 18 days s/p RLTCS on 05/19/14 who presents with constipation. Having small, hard BMs. No improvement w/ stool softener. Requesting enema.  Past Medical History  Diagnosis Date  . HSV infection   . Anxiety     no meds  . Back pain during pregnancy     mid to lower back   OB History  Gravida Para Term Preterm AB SAB TAB Ectopic Multiple Living  # Outcome Date GA Lbr Len/2nd Weight Sex Delivery Anes PTL Lv  2 TRM 05/19/14 [redacted]w[redacted]d  2.795 kg (6 lb 2.6 oz) F LTCS Spinal  Y  1 TRM 05/22/11 [redacted]w[redacted]d  2.85 kg (6 lb 4.5 oz) M LVCS EPI  Y     Past Surgical History  Procedure Laterality Date  . Cesarean section  05/22/2011    Procedure: CESAREAN SECTION;  Surgeon: Kathreen Cosier, MD;  Location: [facility];  Service: Gynecology;  Laterality: N/A;  . Cesarean section with bilateral tubal ligation Bilateral 05/19/2014    Procedure: REPEAT CESAREAN SECTION WITH BILATERAL TUBAL LIGATION;  Surgeon: Dorien Chihuahua. Richardson Dopp, MD;  Location: [facility];  Service: Obstetrics;  Laterality: Bilateral;  . Tubal ligation     History   Social History  . Marital Status: Single    Spouse Name: N/A    Number of Children: N/A  . Years of Education: N/A   Occupational History  . Not on file.   Social History Main Topics  . Smoking status: Former Smoker -- 0.25 packs/day for 2 years    Types: Cigarettes    Quit date: 09/16/2013  . Smokeless tobacco: Never Used  . Alcohol Use: No  . Drug Use: No  . Sexual Activity: Yes    Birth Control/ Protection: None   Other Topics Concern  . Not on file   Social History Narrative  . No narrative on file   No current facility-administered medications on file prior to encounter.   Current Outpatient Prescriptions on File Prior to Encounter  Medication Sig Dispense Refill  . ibuprofen (ADVIL,MOTRIN) 100 MG/5ML  suspension Take 30 mLs (600 mg total) by mouth every 6 (six) hours.  473 mL  1  . oxyCODONE-acetaminophen (PERCOCET/ROXICET) 5-325 MG per tablet Take 1-2 tablets by mouth every 6 (six) hours as needed for severe pain (moderate - severe pain).  30 tablet  0  . Prenatal Vit-Fe Fumarate-FA (PRENATAL MULTIVITAMIN) TABS tablet Take 1 tablet by mouth daily at 12 noon.       Allergies  Allergen Reactions  . Iodine Swelling  . Latex Itching  . Shellfish Allergy Itching    ROS: Pertinent items in HPI.  OBJECTIVE Blood pressure 114/52, pulse 81, temperature 98.7 F (37.1 C), temperature source Oral, resp. rate 18, height 5' 0.4" (1.534 m), weight 89.449 kg (197 lb 3.2 oz), last menstrual period 08/07/2013, unknown if currently breastfeeding. GENERAL: Well-developed, well-nourished female in moderate distress. Difficulty walking.  HEENT: Normocephalic HEART: normal rate RESP: normal effort ABDOMEN: Mildly distended, non-tender EXTREMITIES: Nontender, no edema NEURO: Alert and oriented SPECULUM EXAM: Deferred  LAB RESULTS Results for orders placed during the hospital encounter of 06/07/14 (from the past 24 hour(s))  URINALYSIS, ROUTINE W REFLEX MICROSCOPIC     Status: Abnormal   Collection Time    06/07/14  6:40  AM      Result Value Ref Range   Color, Urine YELLOW  YELLOW   APPearance CLEAR  CLEAR   Specific Gravity, Urine 1.015  1.005 - 1.030   pH 5.5  5.0 - 8.0   Glucose, UA NEGATIVE  NEGATIVE mg/dL   Hgb urine dipstick SMALL (*) NEGATIVE   Bilirubin Urine NEGATIVE  NEGATIVE   Ketones, ur NEGATIVE  NEGATIVE mg/dL   Protein, ur NEGATIVE  NEGATIVE mg/dL   Urobilinogen, UA 0.2  0.0 - 1.0 mg/dL   Nitrite NEGATIVE  NEGATIVE   Leukocytes, UA SMALL (*) NEGATIVE  URINE MICROSCOPIC-ADD ON     Status: None   Collection Time    06/07/14  6:40 AM      Result Value Ref Range   Squamous Epithelial / LPF RARE  RARE   WBC, UA 3-6  <3 WBC/hpf   RBC / HPF 0-2  <3 RBC/hpf   Bacteria, UA RARE   RARE  POCT PREGNANCY, URINE     Status: None   Collection Time    06/07/14  6:48 AM      Result Value Ref Range   Preg Test, Ur NEGATIVE  NEGATIVE    IMAGING No results found.  MAU COURSE Attempted soap suds enema. Pt unable to keep it in. Fluid seeping back out w/out stool. Feels as if she cant push stool out due to pain. Offered lidocaine topical and disimpaction. Pt agreeable.   Care of patient turned over to Venia Carbon, NP at [phone].   [place], CNM 06/07/2014  8:17 AM

## 2014-07-24 ENCOUNTER — Encounter: Payer: Self-pay | Admitting: Obstetrics and Gynecology

## 2014-07-28 ENCOUNTER — Encounter (HOSPITAL_COMMUNITY): Payer: Self-pay

## 2014-11-20 ENCOUNTER — Other Ambulatory Visit: Payer: Self-pay | Admitting: Obstetrics and Gynecology

## 2014-11-20 ENCOUNTER — Other Ambulatory Visit (HOSPITAL_COMMUNITY)
Admission: RE | Admit: 2014-11-20 | Discharge: 2014-11-20 | Disposition: A | Discharge status: Home / Self Care | Payer: 59 | Source: Ambulatory Visit | Attending: Obstetrics and Gynecology | Admitting: Obstetrics and Gynecology

## 2014-11-20 DIAGNOSIS — Z01411 Encounter for gynecological examination (general) (routine) with abnormal findings: Secondary | ICD-10-CM | POA: Insufficient documentation

## 2014-11-20 DIAGNOSIS — R87810 Cervical high risk human papillomavirus (HPV) DNA test positive: Secondary | ICD-10-CM | POA: Insufficient documentation

## 2014-11-20 DIAGNOSIS — Z1151 Encounter for screening for human papillomavirus (HPV): Secondary | ICD-10-CM | POA: Insufficient documentation

## 2014-11-24 LAB — CYTOLOGY - PAP

## 2014-12-20 IMAGING — US US PELVIS COMPLETE
1 series · 13 of 25 positions shown · non-contrast
Comparison: None

CLINICAL DATA: Pregnant, severe mid to lower right-side abdominal
pain question ovarian torsion or appendicitis

EXAM:
TRANSABDOMINAL ULTRASOUND OF PELVIS
DOPPLER ULTRASOUND OF OVARIES
TECHNIQUE: Transabdominal ultrasound examination of the pelvis was performed
including evaluation of the uterus, ovaries, adnexal regions, and
pelvic cul-de-sac. Dedicated sonography of the pregnant uterus was
not performed. Transvaginal imaging was not required to assess blood
flow within the ovaries.
Color and duplex Doppler ultrasound was utilized to evaluate blood
flow to the ovaries.

[Series 1: us pelvis complete · 41 acquisitions, 13 frames shown]
[im 1/41]
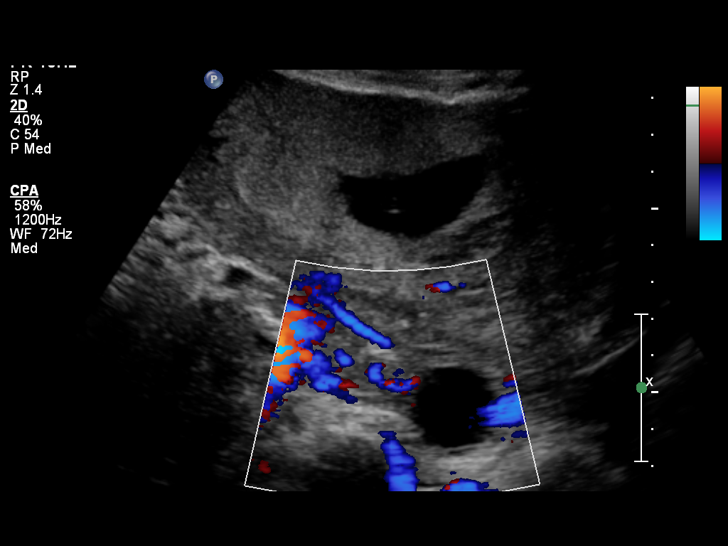
[im 4/41]
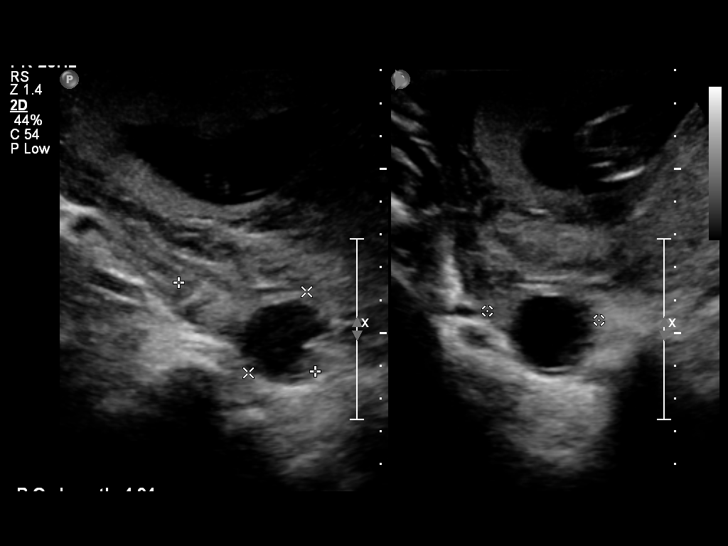
[im 7/41]
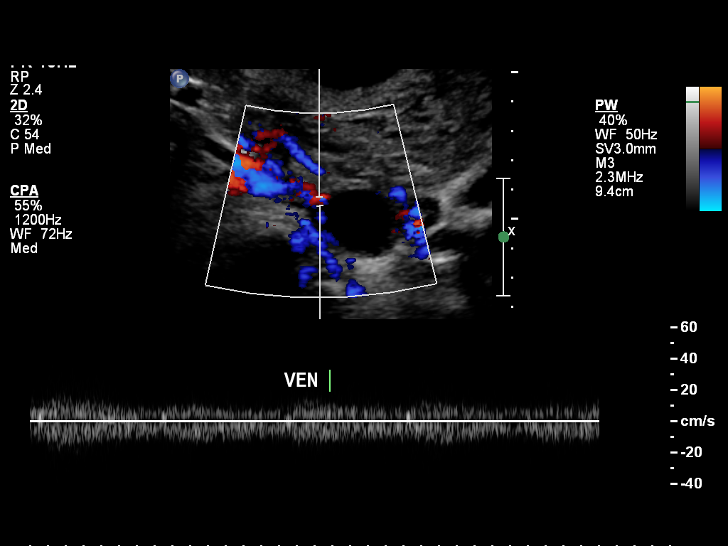
[im 11/41]
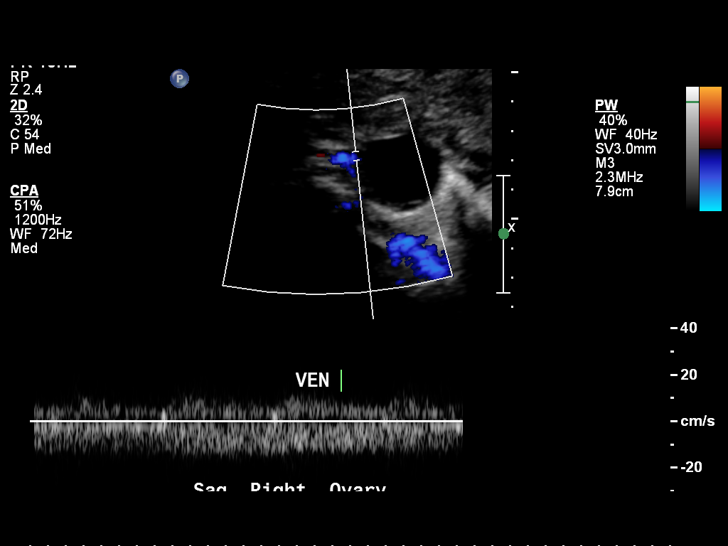
[im 14/41]
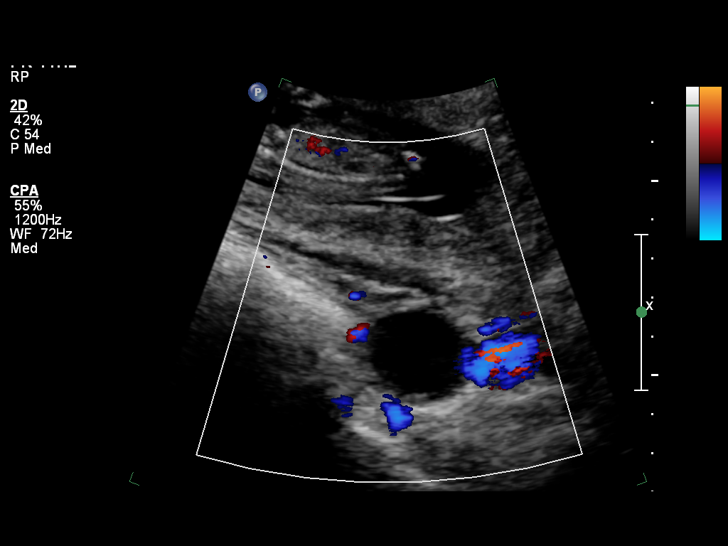
[im 17/41]
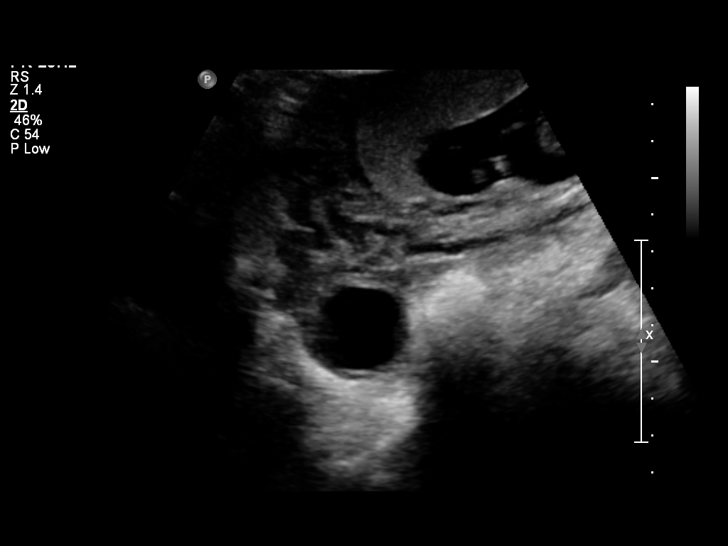
[im 21/41]
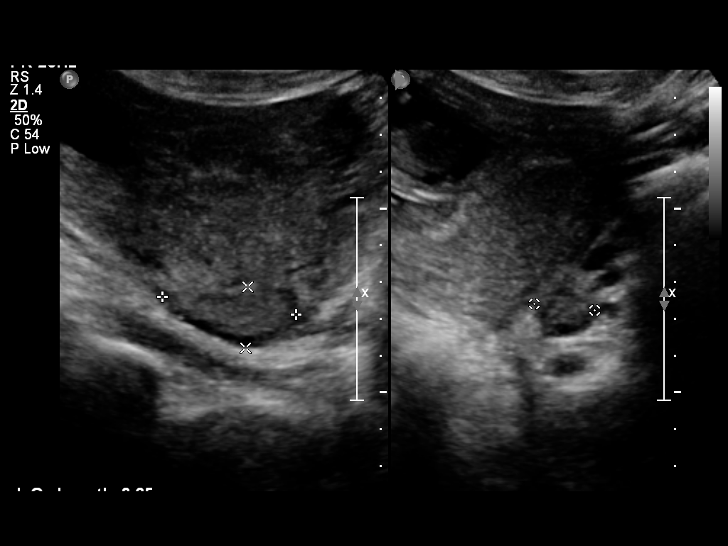
[im 24/41]
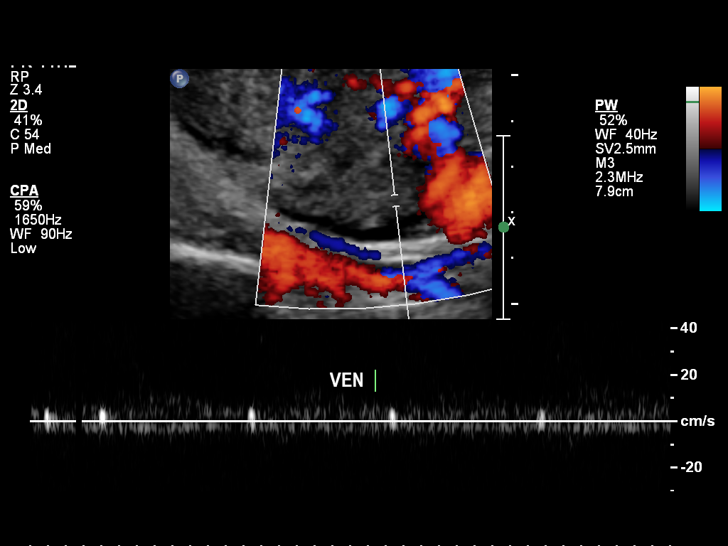
[im 27/41]
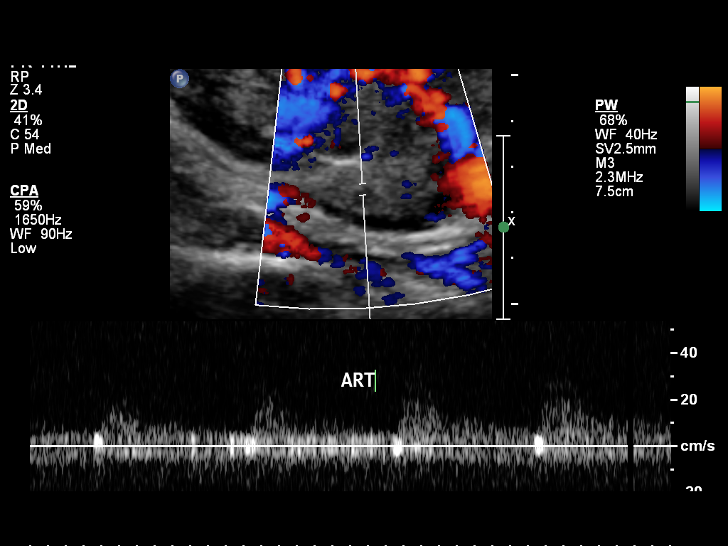
[im 31/41]
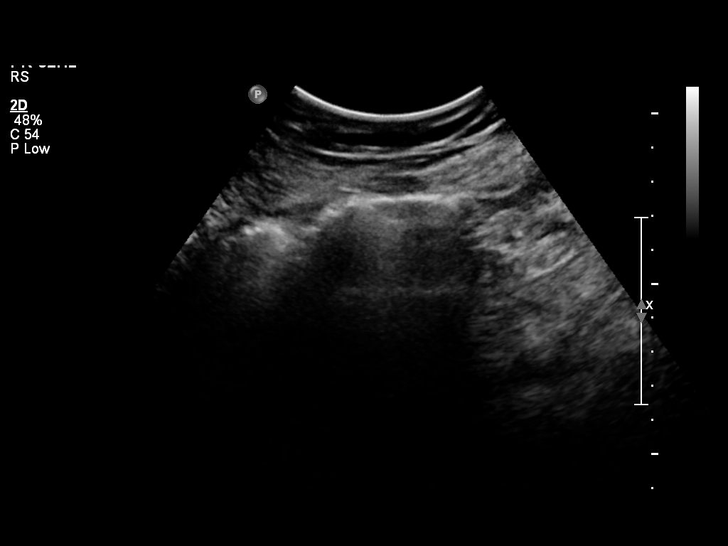
[im 34/41]
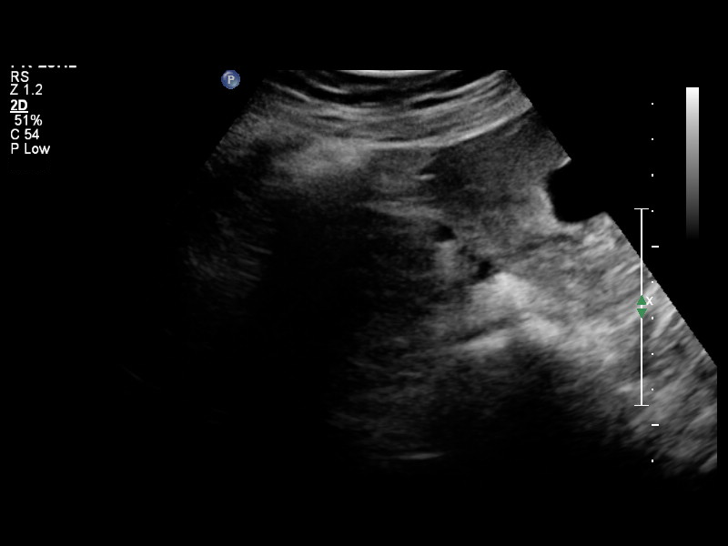
[im 37/41]
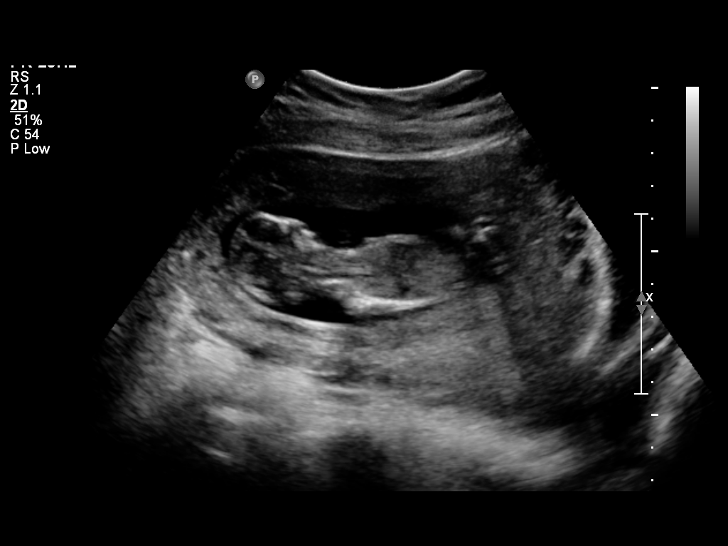
[im 41/41]
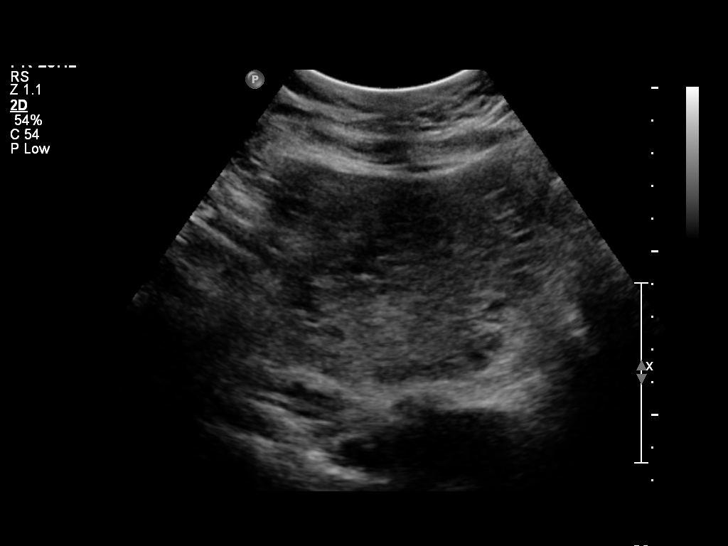

[13 of 25 positions shown; findings below may reference images not displayed]

FINDINGS: Uterus

16 week intrauterine gestation is present, not assessed on this
exam.

Endometrium

N/A

Right ovary

Measurements: 4.9 x 3.0 x 3.4 cm. Small corpus luteal cyst 2.7 x
x 2.8 cm. No additional ovarian masses. Internal blood flow present
within right ovary on color Doppler imaging.

Left ovary

Measurements: 3.7 x 1.7 x 1.7 cm. Normal morphology without mass.
Internal blood flow present within left ovary on color Doppler
imaging.

Pulsed Doppler evaluation demonstrates normal low-resistance
arterial and venous waveforms in both ovaries.

No free pelvic fluid.

Appendix is not visualized.
IMPRESSION: Gravid uterus, not assessed on this exam ; if the pregnancy requires
evaluation recommend dedicated OB ultrasound.

Normal appearing left ovary.

Small corpus luteal cyst within right ovary.

No evidence of ovarian mass or torsion.

Appendix is not visualized.
Failure to visualize an enlarged appendix does not exclude acute
appendicitis.
If this remains a clinical concern in this pregnant patient,
recommend MR imaging using the appendicitis protocol.

## 2015-03-25 ENCOUNTER — Encounter (HOSPITAL_COMMUNITY): Payer: Self-pay | Admitting: *Deleted

## 2015-04-20 ENCOUNTER — Other Ambulatory Visit: Payer: Self-pay | Admitting: Obstetrics and Gynecology

## 2015-04-20 NOTE — H&P (Signed)
Chief Complaint(s):   PreOp History and Physical for 04/21/15   HPI:  General 24 y/o with menorrhagia presents for history and physical in preparation for hysteroscopy D&C And HTA ablation.  she has a problem complaint of heavy and frequent menses. she changes an overnight pad every 2 hours on her heaviest day. she reports a menses every 2-3 wks. she had a tubal ligation at time on cesarean section on 05/19/2014. Her menses have been heavy since she restarted menses in october 2015. she reports heavy menses before btl however they were not as heavy as she is experiencing now. she has tred ocp for menorrhagia in the past however it did not help.  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 Periods : every 2 1/2 weeks and heavy. LMP 04/03/15. Birth control BTL. Last pap smear date 11/20/14. STD HPV. Menarche 11.   OB History:   Number of pregnancies 2. Pregnancy # 1 live birth, C-section delivery, boy. Pregnancy # 2 live birth, C-section, girl.   Surgical History:   C-section 2012     C-section 2015   Hospitalization:   Childbirth x 2   Family History:   Father: alive    Mother: alive, asthma, diagnosed with HTN    Paternal Grand Father: alive, dialysis, stroke    Paternal Grand Mother: alive    Maternal Grand Father: deceased, stroke    Maternal Grand Mother: deceased, epilepsy    Paternal uncle: diagnosed with HTN    Paternal aunt: diagnosed with HTN    Maternal uncle: diagnosed with DM    Maternal aunt: diagnosed with DM, HTN    Son(s): sickle cell anemia    2 brother(s) , 1 sister(s) . 1 son(s) , 1 daughter(s) .    maternal great uncle & maternal great aunt-diabetes, denies any GYN family cancer hx.  Social History:  General Tobacco use  cigarettes: Current smoker, Frequency: intermittent smoker 2 cigarettes per day , Tobacco history last updated 04/14/2015.  Smoking: yes, 2 cigarettes a day .  no Tobacco Exposure.  no Alcohol.  Caffeine: yes, occasionally, -coffee.  no Recreational drug use.  no Exercise.  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  Marital Status: single.  Children: 1 son, 1 daughter.  Seat belt use: yes.  ROS: CONSTITUTIONAL none" options="no,yes" propid="91" itemid="[id number]" categoryid="10464" encounterid="[id number]"Fatigue none. none today" options="no,yes" propid="91" itemid="10467" categoryid="10464" encounterid="[id number]"Fever none today.  SKIN no" options="no,yes" propid="91" itemid="[id number]" categoryid="202750" encounterid="[id number]"Rash no. no" options="no,yes" propid="91" itemid="202757" categoryid="202750" encounterid="[id number]"Suspicious lesions no.  CARDIOLOGY none" options="no,yes" propid="91" itemid="[id number]" categoryid="10488" encounterid="[id number]"Chest pain none.  RESPIRATORY no" options="no" propid="91" itemid="270013" categoryid="138132" encounterid="[id number]"Shortness of breath no. no" options="no,yes" propid="91" itemid="172745" categoryid="138132" encounterid="[id number]"Cough no.  GASTROENTEROLOGY none" options="no,yes" propid="91" itemid="193447" categoryid="10494" encounterid="[id number]"Appetite change none. no" options="no,yes" propid="91" itemid="193449" categoryid="10494" encounterid="[id number]"Change in bowel habits no.  FEMALE REPRODUCTIVE no" options="no,yes" propid="91" itemid="196298" categoryid="10525" encounterid="[id number]"Breast lumps or discharge no. none" options="no,yes" propid="91" itemid="186083" categoryid="10525" encounterid="[id number]"Breast pain none. none" options="no,yes" propid="91" itemid="138198" categoryid="10525" encounterid="[id number]"Dyspareunia none. no" options="no,yes" propid="91" itemid="202654" categoryid="10525"  encounterid="[id number]"Dysuria no. none" options="no,yes" propid="91" itemid="186082" categoryid="10525" encounterid="[id number]"Pelvic pain none. no frequent and heavy " options="no,yes" propid="91" itemid="199173" categoryid="10525" encounterid="[id number]"Regular menses no frequent and heavy . no" options="no,yes" propid="91" itemid="278230" categoryid="10525" encounterid="[id number]"Unusual vaginal discharge no. no" options="no,yes" propid="91" itemid="278942" categoryid="10525" encounterid="[id number]"Vaginal itching no. no"  options="no,yes" propid="91" itemid="278837" categoryid="10525" encounterid="[id number]"Vulvar/labial lesion no.  NEUROLOGY none" options="no,yes" propid="91" itemid="193627" categoryid="12512" encounterid="[id number]"Migraines none. none" options="no,yes" propid="91" itemid="12514" categoryid="12512" encounterid="[id number]"Tingling/numbness none. none" options="no,yes" propid="91" itemid="193467" categoryid="12512" encounterid="[id number]"Visual changes none.  PSYCHOLOGY no" options="" propid="91" itemid="275919" categoryid="10520" encounterid="[id number]"Depression no.  ENDOCRINOLOGY none" options="no,yes" propid="91" itemid="202624" categoryid="12508" encounterid="[id number]"Hot flashes none. no unintentional" options="no,yes" propid="91" itemid="193436" categoryid="12508" encounterid="[id number]"Weight gain no unintentional. none" options="no,yes" propid="91" itemid="138164" categoryid="12508" encounterid="[id number]"Weight loss none.  HEMATOLOGY/LYMPH no" options="no,yes" propid="91" itemid="193454" categoryid="138157" encounterid="[id number]"Anemia no.    Objective:  Vitals:  Wt 219, Wt change 5.4 lb, Ht 61, BMI 41.38, Pulse sitting 92, BP sitting 103/68  Past Results:  Examination:  General Examination McCoy,Tiffany 04/14/2015 03:54:35 PM &gt; , for pelvic exam only" categoryPropId="[id number]" examid="[id number]"CHAPERONE PRESENT McCoy,Tiffany 04/14/2015 03:54:35 PM > , for pelvic exam only.  Physical  Examination: GENERAL in NAD, pleasant"Patient appears in NAD, pleasant. well developed"Build: well developed. overweight"General Appearance: overweight. african-american"Race: african-american.  NECK unremarkable, no lymphadenopathy"Cervical lymph nodes: unremarkable, no lymphadenopathy. normal"ROM: normal. no thyromegaly, non tender"Thyroid: no thyromegaly, non tender.  LUNGS clear to auscultation"Breath sounds: clear to auscultation. no"Dyspnea: no.  HEART none"Murmurs: none. normal"Rate: normal. regular"Rhythm: regular.  ABDOMEN no masses,tenderness,organomegaly, no CVAT"General: no masses,tenderness,organomegaly, no CVAT.  FEMALE GENITOURINARY no mass, non tender"Adnexa: no mass, non tender. normal, no lesions"Anus/perineum: normal, no lesions. normal appearance , no lesions/discharge/bleeding, good pelvic support , external os normal "Cervix/ cuff: normal appearance , no lesions/discharge/bleeding, good pelvic support , external os normal . normal, no lesions, no skin discoloration, no lymphadenopathy"External genitalia: normal, no lesions, no skin discoloration, no lymphadenopathy. deferred"Rectum: deferred. normal external meatus"Urethra: normal external meatus. normal size/shape/consistency, freely mobile, non tender"Uterus: normal size/shape/consistency, freely mobile, non tender. pink/moist mucosa, no lesions, no abnormal discharge, odorless"Vagina: pink/moist mucosa, no lesions, no abnormal discharge, odorless. normal, no lesions, no skin discoloration, non tender"Vulva: normal, no lesions, no skin discoloration, non tender.  EXTREMITIES FROM of all extremities"Extremities FROM of all extremities.  NEUROLOGICAL normal"Gait: normal. alert and oriented x 3"Orientation: alert and oriented x 3.    Assessment:  Assessment:  Menorrhagia with regular cycle - N92.0 (Primary)     Plan:  Treatment:  Menorrhagia with regular cycle  Notes: plan on hysteroscopy D&C and hydrothermal ablation.  . r/b/a of ablation discussed with the patient including but not limited to infection bleeding/ damage to uterus with the need for further surgery. 93 % success rate for decreased menses or amenorrhea. pt voiced understanding and desires to proceed.

## 2015-04-20 NOTE — Anesthesia Preprocedure Evaluation (Signed)
Anesthesia Evaluation  Patient identified by MRN, date of birth, ID band Patient awake    Reviewed: Allergy & Precautions, H&P , Patient's Chart, lab work & pertinent test results, reviewed documented beta blocker date and time   Airway Mallampati: II  TM Distance: >3 FB Neck ROM: full    Dental no notable dental hx.    Pulmonary Current Smoker,  breath sounds clear to auscultation  Pulmonary exam normal       Cardiovascular Rhythm:regular Rate:Normal     Neuro/Psych    GI/Hepatic   Endo/Other  Morbid obesity  Renal/GU      Musculoskeletal   Abdominal   Peds  Hematology   Anesthesia Other Findings   Reproductive/Obstetrics                             Anesthesia Physical Anesthesia Plan  ASA: III  Anesthesia Plan:    Post-op Pain Management:    Induction: Intravenous  Airway Management Planned: LMA  Additional Equipment:   Intra-op Plan:   Post-operative Plan:   Informed Consent: I have reviewed the patients History and Physical, chart, labs and discussed the procedure including the risks, benefits and alternatives for the proposed anesthesia with the patient or authorized representative who has indicated his/her understanding and acceptance.   Dental Advisory Given and Dental advisory given  Plan Discussed with: CRNA and Surgeon  Anesthesia Plan Comments: (Discussed GA with LMA, possible sore throat, potential need to switch to ETT, N/V, pulmonary aspiration. Questions answered. )        Anesthesia Quick Evaluation

## 2015-04-21 ENCOUNTER — Ambulatory Visit (HOSPITAL_COMMUNITY): Payer: 59 | Admitting: Anesthesiology

## 2015-04-21 ENCOUNTER — Ambulatory Visit (HOSPITAL_COMMUNITY)
Admission: RE | Admit: 2015-04-21 | Discharge: 2015-04-21 | Disposition: A | Discharge status: Home / Self Care | Payer: 59 | Source: Ambulatory Visit | Attending: Obstetrics and Gynecology | Admitting: Obstetrics and Gynecology

## 2015-04-21 ENCOUNTER — Encounter (HOSPITAL_COMMUNITY)
Admission: RE | Disposition: A | Discharge status: Home / Self Care | Payer: Self-pay | Source: Ambulatory Visit | Attending: Obstetrics and Gynecology

## 2015-04-21 DIAGNOSIS — Z6841 Body Mass Index (BMI) 40.0 and over, adult: Secondary | ICD-10-CM | POA: Diagnosis not present

## 2015-04-21 DIAGNOSIS — N920 Excessive and frequent menstruation with regular cycle: Secondary | ICD-10-CM | POA: Diagnosis present

## 2015-04-21 DIAGNOSIS — N840 Polyp of corpus uteri: Secondary | ICD-10-CM | POA: Diagnosis not present

## 2015-04-21 DIAGNOSIS — F17200 Nicotine dependence, unspecified, uncomplicated: Secondary | ICD-10-CM | POA: Diagnosis not present

## 2015-04-21 HISTORY — PX: DILITATION & CURRETTAGE/HYSTROSCOPY WITH HYDROTHERMAL ABLATION: SHX5570

## 2015-04-21 LAB — CBC
HEMATOCRIT: 34.9 % — AB (ref 36.0–46.0)
Hemoglobin: 11.6 g/dL — ABNORMAL LOW (ref 12.0–15.0)
MCH: 23.6 pg — AB (ref 26.0–34.0)
MCHC: 33.2 g/dL (ref 30.0–36.0)
MCV: 70.9 fL — ABNORMAL LOW (ref 78.0–100.0)
Platelets: 256 10*3/uL (ref 150–400)
RBC: 4.92 MIL/uL (ref 3.87–5.11)
RDW: 14.9 % (ref 11.5–15.5)
WBC: 7.9 10*3/uL (ref 4.0–10.5)

## 2015-04-21 LAB — PREGNANCY, URINE: Preg Test, Ur: NEGATIVE

## 2015-04-21 SURGERY — DILATATION & CURETTAGE/HYSTEROSCOPY WITH HYDROTHERMAL ABLATION
Anesthesia: General | Site: Uterus

## 2015-04-21 MED ORDER — SILVER NITRATE-POT NITRATE 75-25 % EX MISC
CUTANEOUS | Status: AC
  Filled 2015-04-21: qty 1

## 2015-04-21 MED ORDER — DEXAMETHASONE SODIUM PHOSPHATE 10 MG/ML IJ SOLN
INTRAMUSCULAR | Status: DC | PRN
  Administered 2015-04-21: 10 mg via INTRAVENOUS

## 2015-04-21 MED ORDER — ACETAMINOPHEN 160 MG/5ML PO SOLN
ORAL | Status: AC
  Administered 2015-04-21: 975 mg via ORAL
  Filled 2015-04-21: qty 40.6

## 2015-04-21 MED ORDER — MIDAZOLAM HCL 5 MG/5ML IJ SOLN
INTRAMUSCULAR | Status: DC | PRN
  Administered 2015-04-21: 2 mg via INTRAVENOUS

## 2015-04-21 MED ORDER — ONDANSETRON HCL 4 MG/2ML IJ SOLN
INTRAMUSCULAR | Status: AC
  Filled 2015-04-21: qty 2

## 2015-04-21 MED ORDER — BUPIVACAINE HCL (PF) 0.25 % IJ SOLN
INTRAMUSCULAR | Status: AC
  Filled 2015-04-21: qty 30

## 2015-04-21 MED ORDER — KETOROLAC TROMETHAMINE 30 MG/ML IJ SOLN
INTRAMUSCULAR | Status: AC
  Filled 2015-04-21: qty 1

## 2015-04-21 MED ORDER — ACETAMINOPHEN 160 MG/5ML PO SOLN: 975.0000 mg | Freq: Once | ORAL | Status: DC

## 2015-04-21 MED ORDER — SODIUM CHLORIDE 0.9 % IR SOLN
Status: DC | PRN
  Administered 2015-04-21: 3000 mL

## 2015-04-21 MED ORDER — PROPOFOL 10 MG/ML IV BOLUS
INTRAVENOUS | Status: DC | PRN
  Administered 2015-04-21: 150 mg via INTRAVENOUS

## 2015-04-21 MED ORDER — SILVER NITRATE-POT NITRATE 75-25 % EX MISC
CUTANEOUS | Status: DC | PRN
  Administered 2015-04-21: 4

## 2015-04-21 MED ORDER — OXYCODONE-ACETAMINOPHEN 5-325 MG PO TABS
ORAL_TABLET | ORAL | Status: DC
Start: 2015-04-21 — End: 2015-04-21
  Filled 2015-04-21: qty 1

## 2015-04-21 MED ORDER — SCOPOLAMINE 1 MG/3DAYS TD PT72
MEDICATED_PATCH | TRANSDERMAL | Status: AC
  Administered 2015-04-21: 1.5 mg via TRANSDERMAL
  Filled 2015-04-21: qty 1

## 2015-04-21 MED ORDER — DEXAMETHASONE SODIUM PHOSPHATE 10 MG/ML IJ SOLN
INTRAMUSCULAR | Status: AC
  Filled 2015-04-21: qty 1

## 2015-04-21 MED ORDER — BUPIVACAINE HCL (PF) 0.25 % IJ SOLN
INTRAMUSCULAR | Status: DC | PRN
  Administered 2015-04-21: 20 mL

## 2015-04-21 MED ORDER — OXYCODONE-ACETAMINOPHEN 5-325 MG PO TABS
1.0000 | ORAL_TABLET | ORAL | Status: DC | PRN
  Administered 2015-04-21: 1 via ORAL

## 2015-04-21 MED ORDER — LACTATED RINGERS IV SOLN
INTRAVENOUS | Status: DC
  Administered 2015-04-21 (×3): via INTRAVENOUS

## 2015-04-21 MED ORDER — SCOPOLAMINE 1 MG/3DAYS TD PT72
1.0000 | MEDICATED_PATCH | Freq: Once | TRANSDERMAL | Status: DC
  Administered 2015-04-21: 1.5 mg via TRANSDERMAL

## 2015-04-21 MED ORDER — LIDOCAINE HCL (CARDIAC) 20 MG/ML IV SOLN
INTRAVENOUS | Status: DC | PRN
  Administered 2015-04-21: 60 mg via INTRAVENOUS

## 2015-04-21 MED ORDER — PROPOFOL 10 MG/ML IV BOLUS
INTRAVENOUS | Status: AC
  Filled 2015-04-21: qty 20

## 2015-04-21 MED ORDER — ACETAMINOPHEN 160 MG/5ML PO SOLN
975.0000 mg | Freq: Once | ORAL | Status: AC
  Administered 2015-04-21: 975 mg via ORAL

## 2015-04-21 MED ORDER — FENTANYL CITRATE (PF) 100 MCG/2ML IJ SOLN
INTRAMUSCULAR | Status: AC
  Administered 2015-04-21: 50 ug via INTRAVENOUS
  Filled 2015-04-21: qty 2

## 2015-04-21 MED ORDER — FENTANYL CITRATE (PF) 100 MCG/2ML IJ SOLN
25.0000 ug | INTRAMUSCULAR | Status: DC | PRN
  Administered 2015-04-21 (×2): 50 ug via INTRAVENOUS

## 2015-04-21 MED ORDER — KETOROLAC TROMETHAMINE 30 MG/ML IJ SOLN
INTRAMUSCULAR | Status: DC | PRN
  Administered 2015-04-21: 30 mg via INTRAVENOUS

## 2015-04-21 MED ORDER — FENTANYL CITRATE (PF) 100 MCG/2ML IJ SOLN
INTRAMUSCULAR | Status: AC
  Filled 2015-04-21: qty 2

## 2015-04-21 MED ORDER — ONDANSETRON HCL 4 MG/2ML IJ SOLN
INTRAMUSCULAR | Status: DC | PRN
  Administered 2015-04-21: 4 mg via INTRAVENOUS

## 2015-04-21 MED ORDER — LIDOCAINE HCL (CARDIAC) 20 MG/ML IV SOLN
INTRAVENOUS | Status: AC
  Filled 2015-04-21: qty 5

## 2015-04-21 MED ORDER — FENTANYL CITRATE (PF) 100 MCG/2ML IJ SOLN
INTRAMUSCULAR | Status: DC | PRN
  Administered 2015-04-21: 25 ug via INTRAVENOUS
  Administered 2015-04-21: 50 ug via INTRAVENOUS
  Administered 2015-04-21: 25 ug via INTRAVENOUS

## 2015-04-21 MED ORDER — MIDAZOLAM HCL 2 MG/2ML IJ SOLN
INTRAMUSCULAR | Status: AC
  Filled 2015-04-21: qty 2

## 2015-04-21 MED ORDER — IBUPROFEN 100 MG/5ML PO SUSP: 600.0000 mg | Freq: Four times a day (QID) | ORAL | Status: DC

## 2015-04-21 SURGICAL SUPPLY — 17 items
CANISTER SUCT 3000ML (MISCELLANEOUS) ×3 IMPLANT
CATH FOLEY 2WAY  5CC 16FR SIL (CATHETERS) ×2
CATH FOLEY 2WAY 5CC 16FR SIL (CATHETERS) ×1 IMPLANT
CLOTH BEACON ORANGE TIMEOUT ST (SAFETY) ×3 IMPLANT
CONTAINER PREFILL 10% NBF 60ML (FORM) ×6 IMPLANT
ELECT REM PT RETURN 9FT ADLT (ELECTROSURGICAL)
ELECTRODE REM PT RTRN 9FT ADLT (ELECTROSURGICAL) IMPLANT
GLOVE BIOGEL PI IND STRL 6.5 (GLOVE) ×2 IMPLANT
GLOVE BIOGEL PI INDICATOR 6.5 (GLOVE) ×4
GOWN STRL REUS W/TWL LRG LVL3 (GOWN DISPOSABLE) ×6 IMPLANT
PACK VAGINAL MINOR WOMEN LF (CUSTOM PROCEDURE TRAY) ×3 IMPLANT
PAD OB MATERNITY 4.3X12.25 (PERSONAL CARE ITEMS) ×3 IMPLANT
SET GENESYS HTA PROCERVA (MISCELLANEOUS) ×3 IMPLANT
SUT VIC AB 0 CT1 27 (SUTURE) ×2
SUT VIC AB 0 CT1 27XBRD ANBCTR (SUTURE) ×1 IMPLANT
TOWEL OR 17X24 6PK STRL BLUE (TOWEL DISPOSABLE) ×6 IMPLANT
WATER STERILE IRR 1000ML POUR (IV SOLUTION) ×3 IMPLANT

## 2015-04-21 NOTE — Discharge Instructions (Signed)

## 2015-04-21 NOTE — H&P (Signed)
Date of Initial H&P:04/20/2015 History reviewed, patient examined, no change in status, stable for surgery. 

## 2015-04-21 NOTE — Anesthesia Procedure Notes (Signed)
Procedure Name: LMA Insertion Date/Time: 04/21/2015 8:44 AM Performed by: Renford Dills Pre-anesthesia Checklist: Patient identified, Emergency Drugs available, Suction available and Patient being monitored Patient Re-evaluated:Patient Re-evaluated prior to inductionOxygen Delivery Method: Circle system utilized Preoxygenation: Pre-oxygenation with 100% oxygen Intubation Type: IV induction Ventilation: Mask ventilation without difficulty LMA: LMA inserted LMA Size: 4.0 Number of attempts: 1 Placement Confirmation: positive ETCO2 and breath sounds checked- equal and bilateral Dental Injury: Teeth and Oropharynx as per pre-operative assessment

## 2015-04-21 NOTE — Op Note (Signed)
04/21/2015  9:55 AM  PATIENT:  Joanne Ellis  24 y.o. female  PRE-OPERATIVE DIAGNOSIS:  N92.0  Menorrhagia  POST-OPERATIVE DIAGNOSIS:  Menorrhagia  PROCEDURE:  Procedure(s): DILATATION & CURETTAGE/HYSTEROSCOPY WITH HYDROTHERMAL ABLATION (N/A)  SURGEON:  Surgeon(s) and Role:    * Gerald Leitz, MD - Primary  PHYSICIAN ASSISTANT:   ASSISTANTS: none   ANESTHESIA:   general  EBL:  Total I/O In: 1200 [I.V.:1200] Out: 170 [Urine:150; Blood:20]  BLOOD ADMINISTERED:none  DRAINS: none   LOCAL MEDICATIONS USED:  MARCAINE     SPECIMEN:  Source of Specimen:  endometrial currettings   DISPOSITION OF SPECIMEN:  PATHOLOGY  COUNTS:  YES  TOURNIQUET:  * No tourniquets in log *  DICTATION: .Dragon Dictation  PLAN OF CARE: Discharge to home after PACU  PATIENT DISPOSITION:  PACU - hemodynamically stable.   Delay start of Pharmacological VTE agent (>24 hrs) due to surgical blood loss or risk of bleeding: not applicable   Procedure. The patient was taken to the operative room were a time out was performed. She was placed in the dorsal lithotomy position and prepped and draped in the normal sterile fashion. A speculum was placed in the vaginal vault. The anterior lip of the cervix was grasped with a single tooth tenaculum. The uterus was sounded to 10 cm. The cervix was dilated to 8 mm. The hydrothermal hysteroscope was inserted. The ostia were visualized bilaterally. There was no evidence of perforation. The hydrothermal ablation was performed for 10 minutes.  The hysteroscope was removed.  Sharp Curettage was performed ... 10 cc of 25% marcaine was injected at the 4 and 8 o'clock position.   Pt had bleeding from the cervix at the injection site for marcaine... Silver nitrate was applied.. Bleeding continued... A stitch of 0 vicryl was placed at the 4 o'clock position of the cervix.[employer] Excellent hemostases was noted. The single tooth tenaculum was removed.  Bleeding from the tenaculum site was  noted.. This was controlled with Silver Nitrate... Excellent hemostasis was noted.   Sponge lap and needle counts were correct x 2.  The patient was awakened from anesthesia and taken to the recovery room in stable condition.

## 2015-04-21 NOTE — Transfer of Care (Signed)
Immediate Anesthesia Transfer of Care Note  Patient: Joanne Ellis  Procedure(s) Performed: Procedure(s): DILATATION & CURETTAGE/HYSTEROSCOPY WITH HYDROTHERMAL ABLATION (N/A)  Patient Location: PACU  Anesthesia Type:General  Level of Consciousness: awake and sedated  Airway & Oxygen Therapy: Patient Spontanous Breathing and Patient connected to nasal cannula oxygen  Post-op Assessment: Report given to RN and Post -op Vital signs reviewed and stable  Post vital signs: stable  Last Vitals:  Filed Vitals:   04/21/15 0749  BP: 107/64  Pulse: 82  Temp: 36.9 C  Resp: 18    Complications: No apparent anesthesia complications

## 2015-04-22 ENCOUNTER — Encounter (HOSPITAL_COMMUNITY): Payer: Self-pay | Admitting: Obstetrics and Gynecology

## 2015-04-28 NOTE — Anesthesia Postprocedure Evaluation (Signed)
  Anesthesia Post-op Note  Patient: Joanne Ellis  Procedure(s) Performed: Procedure(s): DILATATION & CURETTAGE/HYSTEROSCOPY WITH HYDROTHERMAL ABLATION (N/A) Patient is awake and responsive. Pain and nausea are reasonably well controlled. Vital signs are stable and clinically acceptable. Oxygen saturation is clinically acceptable. There are no apparent anesthetic complications at this time. Patient is ready for discharge.

## 2015-08-13 ENCOUNTER — Other Ambulatory Visit (HOSPITAL_COMMUNITY)
Admission: RE | Admit: 2015-08-13 | Discharge: 2015-08-13 | Disposition: A | Discharge status: Home / Self Care | Payer: 59 | Source: Ambulatory Visit | Attending: Obstetrics and Gynecology | Admitting: Obstetrics and Gynecology

## 2015-08-13 ENCOUNTER — Other Ambulatory Visit: Payer: Self-pay | Admitting: Nurse Practitioner

## 2015-08-13 DIAGNOSIS — Z01411 Encounter for gynecological examination (general) (routine) with abnormal findings: Secondary | ICD-10-CM | POA: Insufficient documentation

## 2015-08-17 LAB — CYTOLOGY - PAP

## 2017-02-17 DIAGNOSIS — K029 Dental caries, unspecified: Secondary | ICD-10-CM

## 2017-02-17 NOTE — ED Notes (Signed)
Julie Rosario is a 26 y.o. female that was discharged in stable condition.  The patients diagnosis, condition and treatment were explained to  patient and aftercare instructions were given.  The patient verbalized understanding. Patient armband removed and shredded.

## 2017-02-17 NOTE — ED Triage Notes (Addendum)
Pt states right upper  tooth pain x 2 days.  Pt states she has taken Ibuprofen & Aleve for pain without relief.

## 2017-02-17 NOTE — ED Provider Notes (Signed)
[place] [facility]  [facility]      10:36 PM    Date: 02/17/2017  Patient Name: Julie Rosario    History of Presenting Illness     History Provided By: Patient    Chief Complaint: left upper toothache  Duration:  Days  Timing:  Intermittent  Location: left upper jaw   Quality: Sharp  Severity: Moderate  Modifying Factors: improved with Aleve or Ibuprofen   Associated Symptoms: denies any other associated signs or symptoms    26 y.o. female presents to the emergency department left upper toothache for the past 2-3 days.  She states that it is a sharp pain.  She notes taking Aleve and putting Peroxide on it.  She does not have a Adult nurse.  She denies any fever, chills, difficulty breathing/swallowing, or other symptoms at this time.       No other complaints.     Nursing notes regarding the HPI and triage nursing notes were reviewed.     Prior medical records were reviewed.     Current Facility-Administered Medications   Medication Dose Route Frequency Provider Last Rate Last Dose   ??? penicillin v potassium (VEETID) tablet 500 mg  500 mg Oral NOW Nicolette Bang, PA         Current Outpatient Prescriptions   Medication Sig Dispense Refill   ??? penicillin v potassium (VEETID) 500 mg tablet Take 1 Tab by mouth four (4) times daily for 7 days. 28 Tab 0       Past History     Past Medical History:  History reviewed. No pertinent past medical history.    Past Surgical History:  Past Surgical History:   Procedure Laterality Date   ??? HX WISDOM TEETH EXTRACTION         Family History:  History reviewed. No pertinent family history.    Social History:  Social History   Substance Use Topics   ??? Smoking status: Never Smoker   ??? Smokeless tobacco: Never Used   ??? Alcohol use Yes      Comment: occasional       Allergies:  No Known Allergies    Patient's primary care provider (as noted in EPIC):  None    Review of Systems   Constitutional:  Denies malaise, fever, chills.   ENMT:  + left upper toothache.    Neck:  Denies injury or pain.   Skin: Denies injury, rash, itching or skin changes.  All other systems negative as reviewed.     Visit Vitals   ??? BP 138/84 (BP 1 Location: Left arm, BP Patient Position: At rest)   ??? Pulse 60   ??? Temp 98.7 ??F (37.1 ??C)   ??? Resp 18   ??? Ht 5' (1.524 m)   ??? Wt 56.2 kg (124 lb)   ??? SpO2 100%   ??? BMI 24.22 kg/m2       PHYSICAL EXAM:    CONSTITUTIONAL:  Alert, in no apparent distress;  well developed;  well nourished.  HEAD:  Normocephalic, atraumatic.  EYES:  EOMI.  Non-icteric sclera.  Normal conjunctiva.  ENTM:  Nose:  no rhinorrhea.  Throat:  no erythema or exudate, mucous membranes moist.  Uvula midline, airway patent.   NECK:  Supple  RESPIRATORY:  Chest clear, equal breath sounds, good air movement.  CARDIOVASCULAR:  Regular rate and rhythm.  No murmurs, rubs, or gallops.  NEURO:  Moves all four extremities, and grossly normal motor exam.  SKIN:  No rashes;  Normal for age.  PSYCH:  Alert and normal affect.    Oropharynx/ teeth:  Tooth # 15 has focal tenderness to percussion and is decayed/broken to the posterior aspect .  There is no fluctuance or abscess.  Oropharynx is otherwise normal and symmetric.       IMPRESSION AND MEDICAL DECISION MAKING:  Based upon the patient???s presentation with noted HPI and PE, along with the work up done in the emergency department, I believe that the patient is having a toothache without obvious infection, however, will write for penicillin and have f/u with dentist.  Provided list of dental clinics.  Pt to take Aleve OR Motrin for pain, not both.  States she took them spaced apart today.     Diagnosis:   1. Toothache    2. Dental decay      Disposition: Discharge    Follow-up Information     Follow up With Details Comments Contact Info    [facility] In 3 days  [address]  [place] [id number]  [phone]    [facility]  If symptoms worsen [address]  [place] [phone]  [phone]           Patient's Medications   Start Taking    PENICILLIN V POTASSIUM (VEETID) 500 MG TABLET    Take 1 Tab by mouth four (4) times daily for 7 days.   Continue Taking    No medications on file   These Medications have changed    No medications on file   Stop Taking    ONDANSETRON (ZOFRAN ODT) 4 MG DISINTEGRATING TABLET    Take 1 Tab by mouth every eight (8) hours as needed for Nausea.     Nicolette BangAshlee L Annita Ratliff, PA

## 2017-02-18 ENCOUNTER — Inpatient Hospital Stay
Admit: 2017-02-18 | Discharge: 2017-02-18 | Disposition: A | Discharge status: Home / Self Care | Payer: Self-pay | Attending: Emergency Medicine

## 2017-02-18 MED ORDER — PENICILLIN V-K 250 MG TAB
250 mg | ORAL | Status: AC
Start: 2017-02-18 — End: 2017-02-17
  Administered 2017-02-18: 03:00:00 via ORAL

## 2017-02-18 MED ORDER — PENICILLIN V-K 500 MG TAB
500 mg | ORAL_TABLET | Freq: Four times a day (QID) | ORAL | 0 refills | Status: AC
Start: 2017-02-18 — End: 2017-02-24

## 2017-02-18 MED FILL — PENICILLIN V-K 250 MG TAB: 250 mg | ORAL | Qty: 2

## 2018-01-15 DIAGNOSIS — Z79899 Other long term (current) drug therapy: Secondary | ICD-10-CM | POA: Insufficient documentation

## 2018-01-15 DIAGNOSIS — H1012 Acute atopic conjunctivitis, left eye: Secondary | ICD-10-CM | POA: Insufficient documentation

## 2018-01-15 DIAGNOSIS — Z91040 Latex allergy status: Secondary | ICD-10-CM | POA: Insufficient documentation

## 2018-01-15 DIAGNOSIS — F17210 Nicotine dependence, cigarettes, uncomplicated: Secondary | ICD-10-CM | POA: Insufficient documentation

## 2018-01-16 ENCOUNTER — Other Ambulatory Visit: Payer: Self-pay

## 2018-01-16 ENCOUNTER — Encounter (HOSPITAL_COMMUNITY): Payer: Self-pay | Admitting: Emergency Medicine

## 2018-01-16 ENCOUNTER — Emergency Department (HOSPITAL_COMMUNITY)
Admission: EM | Admit: 2018-01-16 | Discharge: 2018-01-16 | Disposition: A | Discharge status: Home / Self Care | Payer: 59 | Attending: Emergency Medicine | Admitting: Emergency Medicine

## 2018-01-16 DIAGNOSIS — H1012 Acute atopic conjunctivitis, left eye: Secondary | ICD-10-CM

## 2018-01-16 MED ORDER — OLOPATADINE HCL 0.1 % OP SOLN
1.0000 [drp] | Freq: Two times a day (BID) | OPHTHALMIC | Status: DC
  Administered 2018-01-16: 1 [drp] via OPHTHALMIC
  Filled 2018-01-16: qty 5

## 2018-01-16 MED ORDER — TETRACAINE HCL 0.5 % OP SOLN
2.0000 [drp] | Freq: Once | OPHTHALMIC | Status: AC
  Administered 2018-01-16: 2 [drp] via OPHTHALMIC
  Filled 2018-01-16: qty 4

## 2018-01-16 MED ORDER — FLUORESCEIN SODIUM 1 MG OP STRP
1.0000 | ORAL_STRIP | Freq: Once | OPHTHALMIC | Status: AC
  Administered 2018-01-16: 1 via OPHTHALMIC
  Filled 2018-01-16: qty 1

## 2018-01-16 NOTE — ED Notes (Signed)
PT UNABLE TO COMPLETE VISUAL ACUITY SCREENING PT UNABLE TO KEEP AFFECTED, LEFT, EYE OPEN PT STATES "BLURRY VISION IN LEFT EYE"

## 2018-01-16 NOTE — ED Notes (Signed)
Bed: WTR7 Expected date:  Expected time:  Means of arrival:  Comments: 

## 2018-01-16 NOTE — ED Triage Notes (Signed)
Pt reports having eye drainage since 01/11/18 and was seen at urgent care and dx with pink eye and prescribed eye drops on 01/13/18 and reports no improvement in symptoms since then.

## 2018-01-16 NOTE — ED Provider Notes (Signed)
[facility]-EMERGENCY DEPT Provider Note   CSN: [id number] Arrival date & time: 01/15/18  2351     History   Chief Complaint Chief Complaint  Patient presents with  . Eye Drainage    HPI Joanne Ellis is a 27 y.o. female.  Patient presents with painful, swollen left eye x 2 days. There has been matting that makes it difficult for the eye to open. There is drainage that is clear. She was seen 2 days ago and given an antibiotic eye drop but symptoms have not improved. No fever, facial swelling other than the left eye lids. No visual change.   The history is provided by the patient. No language interpreter was used.    Past Medical History:  Diagnosis Date  . Anxiety    no meds  . Back pain during pregnancy    mid to lower back  . HSV infection     Patient Active Problem List   Diagnosis Date Noted  . Menorrhagia with regular cycle 04/21/2015  . S/P cesarean section 05/19/2014    Past Surgical History:  Procedure Laterality Date  . CESAREAN SECTION  05/22/2011   Procedure: CESAREAN SECTION;  Surgeon: Kathreen CosierBernard A Marshall, MD;  Location: [facility];  Service: Gynecology;  Laterality: N/A;  . CESAREAN SECTION WITH BILATERAL TUBAL LIGATION Bilateral 05/19/2014   Procedure: REPEAT CESAREAN SECTION WITH BILATERAL TUBAL LIGATION;  Surgeon: Dorien Chihuahuaara J. Richardson Doppole, MD;  Location: [facility];  Service: Obstetrics;  Laterality: Bilateral;  . DILITATION & CURRETTAGE/HYSTROSCOPY WITH HYDROTHERMAL ABLATION N/A 04/21/2015   Procedure: DILATATION & CURETTAGE/HYSTEROSCOPY WITH HYDROTHERMAL ABLATION;  Surgeon: Gerald Leitzara Cole, MD;  Location: [facility];  Service: Gynecology;  Laterality: N/A;  . TUBAL LIGATION       OB History    Gravida  2   Para  2   Term  2   Preterm      AB      Living  2     SAB      TAB      Ectopic      Multiple      Live Births  2            Home Medications    Prior to Admission medications   Medication Sig Start Date End Date Taking?  Authorizing Provider  ibuprofen (ADVIL,MOTRIN) 200 MG tablet Take 400 mg by mouth every 6 (six) hours as needed for moderate pain.   Yes [provider]  Multiple Vitamin (MULTIVITAMIN WITH MINERALS) TABS tablet Take 1 tablet by mouth daily.   Yes [provider]  sulfacetamide (BLEPH-10) 10 % ophthalmic solution Place 2 drops into the left eye See admin instructions. Place 2 drops into eye every 2 hours for 2 days and then 4 times a day for 5 days 01/13/18  Yes [provider]  ibuprofen (ADVIL,MOTRIN) 100 MG/5ML suspension Take 30 mLs (600 mg total) by mouth every 6 (six) hours. Patient not taking: Reported on 01/16/2018 04/21/15   Gerald Leitzole, Tara, MD    Family History Family History  Problem Relation Age of Onset  . Asthma Mother   . Miscarriages / [place] Mother   . Alcohol abuse Maternal Grandmother   . Alcohol abuse Maternal Grandfather   . Alcohol abuse Paternal Grandfather   . Stroke Paternal Grandfather     Social History Social History   Tobacco Use  . Smoking status: Current Every Day Smoker    Packs/day: 0.25    Years: 2.00  Pack years: 0.50    Types: Cigarettes    Last attempt to quit: 09/16/2013    Years since quitting: 4.3  . Smokeless tobacco: Never Used  Substance Use Topics  . Alcohol use: No  . Drug use: No     Allergies   Iodine; Latex; and Shellfish allergy   Review of Systems Review of Systems  Constitutional: Negative for fever.  HENT: Positive for facial swelling.   Eyes: Positive for pain, discharge and redness.  Gastrointestinal: Negative for nausea.  Neurological: Negative for headaches.     Physical Exam Updated Vital Signs BP 112/78 (BP Location: Right Arm)   Pulse 89   Temp 98.5 F (36.9 C) (Oral)   Resp 14   Ht 5\' 1"  (1.549 m)   Wt 99.8 kg (220 lb)   LMP 12/16/2017   SpO2 100%   BMI 41.57 kg/m   Physical Exam  Constitutional: She is oriented to person, place, and time. She appears well-developed  and well-nourished.  Eyes: Pupils are equal, round, and reactive to light. EOM are normal. No scleral icterus.  Left eye lid swelling without palpable tenderness. There is substantial clear drainage from the left eye. Conjunctiva red and swollen. No definite chemosis. Fluroscein stain negative for abrasion or ulceration of cornea.  Neck: Normal range of motion.  Pulmonary/Chest: Effort normal.  Neurological: She is alert and oriented to person, place, and time.  Skin: Skin is warm and dry.     ED Treatments / Results  Labs (all labs ordered are listed, but only abnormal results are displayed) Labs Reviewed - No data to display  EKG None  Radiology No results found.  Procedures Procedures (including critical care time)  Medications Ordered in ED Medications  olopatadine (PATANOL) 0.1 % ophthalmic solution 1 drop (has no administration in time range)  tetracaine (PONTOCAINE) 0.5 % ophthalmic solution 2 drop (2 drops Left Eye Given 01/16/18 0445)  fluorescein ophthalmic strip 1 strip (1 strip Left Eye Given 01/16/18 0445)     Initial Impression / Assessment and Plan / ED Course  I have reviewed the triage vital signs and the nursing notes.  Pertinent labs & imaging results that were available during my care of the patient were reviewed by me and considered in my medical decision making (see chart for details).     The patient presents for evaluation of left eye pain and drainage x 2 days. On antibiotic drop without improvement.   Suspect allergic conjunctivitis. No purulent drainage. Cornea clear without positive staining. There is no tenderness of the eye to external palpation, doubt elevated intraocular pressure.   Feel the swollen and red conjunctiva represent allergic conjunctivitis, not bacterial. Will stop her abx drop and start Patanol. She is given a referral to ophthalmology for further management of eye swelling and redness.   Final Clinical Impressions(s) / ED  Diagnoses   Final diagnoses:  None   1. allergic conjunctivitis    ED Discharge Orders    None       Elpidio Anis, PA-C 01/16/18 0524    Derwood Kaplan, MD 01/16/18 2303

## 2018-01-30 ENCOUNTER — Ambulatory Visit: Payer: Self-pay

## 2018-02-14 DIAGNOSIS — K0889 Other specified disorders of teeth and supporting structures: Secondary | ICD-10-CM

## 2018-02-14 NOTE — ED Provider Notes (Signed)
ED Provider Notes by Joanna Hews, PA at 02/14/18 2314                Author: Joanna Hews, PA  Service: EMERGENCY  Author Type: Physician Assistant       Filed: 02/14/18 2353  Date of Service: 02/14/18 2314  Status: Attested           Editor: Joanna Hews, PA (Physician Assistant)  Cosigner: Rudean Haskell, MD at 02/15/18 [phone]          Attestation signed by Rudean Haskell, MD at 02/15/18 [phone]          I was available for consultation in the Emergency Department.                                     EMERGENCY DEPARTMENT HISTORY AND PHYSICAL EXAM      Date: 02/14/2018   Patient Name: Julie Rosario        History of Presenting Illness          Chief Complaint       Patient presents with        ?  Dental Pain              History Provided By: Patient      Chief Complaint: tooth pain   Duration: 8  Months   Timing:  Intermittent, Progressive and Worsening   Location: left upper jaw   Quality: Aching   Severity: Mild   Modifying Factors: Listerine and peroxide exacerbates pain   Associated Symptoms: reports bad breath associated with tooth         Additional History (Context): Julie Rosario  is a 27 y.o. female  with No significant past medical history who presents with intermittent chronic  dental pain to the left upper jaw that has worsened to a nagging pain in the past few days. Patient states that she chipped her left upper tooth 8 months ago but has yet to schedule a dental visit due to lack of insurance coverage. She reports that she  has tried to clean the tooth with Listerine and Peroxide due to her breath smelling bad but states that worsens her pain. Denies medications tried. She denies fever, chills, difficulty swallowing, headaches, and upper respiratory symptoms. No other complaints  at this time.       PCP: None              Past History        Past Medical History:   No past medical history on file.      Past Surgical History:     Past Surgical History:         Procedure  Laterality  Date           ?  HX WISDOM TEETH EXTRACTION               Family History:   No family history on file.      Social History:     Social History          Tobacco Use         ?  Smoking status:  Never Smoker     ?  Smokeless tobacco:  Never Used       Substance Use Topics         ?  Alcohol use:  Yes  Comment: occasional         ?  Drug use:  No           Allergies:   No Known Allergies           Review of Systems     Review of Systems    Constitutional: Negative for chills and fever.    HENT: Positive for dental problem. Negative for congestion, facial swelling, rhinorrhea, sore throat and trouble swallowing.     Respiratory: Negative for cough and shortness of breath.     Cardiovascular: Negative for chest pain.    Gastrointestinal: Negative for abdominal pain.    Musculoskeletal: Negative for neck pain and neck stiffness.    Neurological: Negative for light-headedness and headaches.    All other systems reviewed and are negative.      All Other Systems Negative     Physical Exam          Vitals:          02/14/18 2247        BP:  121/65     Pulse:  72     Resp:  16     Temp:  97.6 ??F (36.4 ??C)        SpO2:  100%        Physical Exam    Constitutional: She is oriented to person, place, and time. She appears well-developed and well-nourished. No distress.    HENT:    Head: Normocephalic and atraumatic.   Right Ear: Hearing, tympanic membrane, external ear and ear canal normal.   Left Ear: Hearing, tympanic membrane, external ear and ear canal normal.    Nose: Nose normal.    Mouth/Throat: Uvula is midline, oropharynx is clear and moist and mucous membranes are normal. No trismus in the jaw. Abnormal dentition.  Dental caries present. No dental abscesses or uvula swelling. No oropharyngeal exudate, posterior oropharyngeal edema or tonsillar abscesses.         No fluctuance or edema noted on palpation of upper jaw.     Cardiovascular: Normal rate and regular rhythm. Exam reveals no gallop and no friction rub.    No  murmur heard.   Pulmonary/Chest: Effort normal and breath sounds normal. No respiratory distress.    Neurological: She is alert and oriented to person, place, and time.    Skin: She is not diaphoretic.                      Diagnostic Study Results        Labs -    No results found for this or any previous visit (from the past 12 hour(s)).      Radiologic Studies -      No orders to display          CT Results   (Last 48 hours)          None                 CXR Results   (Last 48 hours)          None                       Medical Decision Making     I am the first provider for this patient.      I reviewed the vital signs, available nursing notes, past medical history, past surgical history, family history and social history.  Vital Signs-Reviewed the patient's vital signs.         Pulse Oximetry Analysis -  100% on RA      Records Reviewed: Nursing Notes      Procedures:   Procedures      Provider Notes (Medical Decision Making): 26yoF to c/o left upper dental pain; has broken tooth x several months.  Afeb, no cellulitis. rx for abx and f/u with dental clinic.      MED RECONCILIATION:     No current facility-administered medications for this encounter.           Current Outpatient Medications        Medication  Sig         ?  penicillin v potassium (VEETID) 500 mg tablet  Take 1 Tab by mouth three (3) times daily for 10 days.         ?  naproxen (NAPROSYN) 500 mg tablet  Take 1 Tab by mouth two (2) times daily (with meals) for 10 days.           Disposition:   home      DISCHARGE NOTE:       Pt has been reexamined.  Patient has no new complaints, changes, or physical findings.  Care plan outlined and precautions discussed.   All medications were reviewed with the patient; will d/c home with pcn. All of pt's questions and concerns were addressed.  Patient was instructed and agrees to follow up with clinic, as well as to return to the ED upon further deterioration. Patient is ready to go home.        Follow-up  Information               Follow up With  Specialties  Details  Why  Contact Info              [facility]        [address]   [place] [address]   [phone]              [facility]        [address]   [address]   [place] [address]   [phone]                  Current Discharge Medication List              START taking these medications          Details        penicillin v potassium (VEETID) 500 mg tablet  Take 1 Tab by mouth three (3) times daily for 10 days.   Qty: 30 Tab, Refills:  0               naproxen (NAPROSYN) 500 mg tablet  Take 1 Tab by mouth two (2) times daily (with meals) for 10 days.   Qty: 20 Tab, Refills:  0                              Diagnosis        Clinical Impression:       1.  Pain, dental

## 2018-02-14 NOTE — ED Notes (Signed)
Patient discharge instructions discussed with patient by Adrian Prince, PA

## 2018-02-14 NOTE — ED Notes (Signed)
Pt arrived through triage with c/o dental infection and gum swelling.

## 2018-02-14 NOTE — ED Provider Notes (Signed)
EMERGENCY DEPARTMENT HISTORY AND PHYSICAL EXAM    Date: 02/14/2018  Patient Name: Julie Rosario    History of Presenting Illness     Chief Complaint   Patient presents with   ??? Dental Pain         History Provided By: Patient    Chief Complaint: tooth pain  Duration: 8 Months  Timing:  Intermittent, Progressive and Worsening  Location: left upper jaw  Quality: Aching  Severity: Mild  Modifying Factors: Listerine and peroxide exacerbates pain  Associated Symptoms: reports bad breath associated with tooth      Additional History (Context): Julie Rosario is a 27 y.o. female with No significant past medical history who presents with intermittent chronic dental pain to the left upper jaw that has worsened to a nagging pain in the past few days. Patient states that she chipped her left upper tooth 8 months ago but has yet to schedule a dental visit due to lack of insurance coverage. She reports that she has tried to clean the tooth with Listerine and Peroxide due to her breath smelling bad but states that worsens her pain. Denies medications tried. She denies fever, chills, difficulty swallowing, headaches, and upper respiratory symptoms. No other complaints at this time.     PCP: None        Past History     Past Medical History:  No past medical history on file.    Past Surgical History:  Past Surgical History:   Procedure Laterality Date   ??? HX WISDOM TEETH EXTRACTION         Family History:  No family history on file.    Social History:  Social History     Tobacco Use   ??? Smoking status: Never Smoker   ??? Smokeless tobacco: Never Used   Substance Use Topics   ??? Alcohol use: Yes     Comment: occasional   ??? Drug use: No       Allergies:  No Known Allergies      Review of Systems   Review of Systems   Constitutional: Negative for chills and fever.   HENT: Positive for dental problem. Negative for congestion, facial swelling, rhinorrhea, sore throat and trouble swallowing.     Respiratory: Negative for cough and shortness of breath.    Cardiovascular: Negative for chest pain.   Gastrointestinal: Negative for abdominal pain.   Musculoskeletal: Negative for neck pain and neck stiffness.   Neurological: Negative for light-headedness and headaches.   All other systems reviewed and are negative.    All Other Systems Negative  Physical Exam     Vitals:    02/14/18 2247   BP: 121/65   Pulse: 72   Resp: 16   Temp: 97.6 ??F (36.4 ??C)   SpO2: 100%     Physical Exam   Constitutional: She is oriented to person, place, and time. She appears well-developed and well-nourished. No distress.   HENT:   Head: Normocephalic and atraumatic.   Right Ear: Hearing, tympanic membrane, external ear and ear canal normal.   Left Ear: Hearing, tympanic membrane, external ear and ear canal normal.   Nose: Nose normal.   Mouth/Throat: Uvula is midline, oropharynx is clear and moist and mucous membranes are normal. No trismus in the jaw. Abnormal dentition. Dental caries present. No dental abscesses or uvula swelling. No oropharyngeal exudate, posterior oropharyngeal edema or tonsillar abscesses.       No fluctuance or edema noted on palpation of  upper jaw.    Cardiovascular: Normal rate and regular rhythm. Exam reveals no gallop and no friction rub.   No murmur heard.  Pulmonary/Chest: Effort normal and breath sounds normal. No respiratory distress.   Neurological: She is alert and oriented to person, place, and time.   Skin: She is not diaphoretic.              Diagnostic Study Results     Labs -   No results found for this or any previous visit (from the past 12 hour(s)).    Radiologic Studies -   No orders to display     CT Results  (Last 48 hours)    None        CXR Results  (Last 48 hours)    None            Medical Decision Making   I am the first provider for this patient.    I reviewed the vital signs, available nursing notes, past medical history, past surgical history, family history and social history.     Vital Signs-Reviewed the patient's vital signs.      Pulse Oximetry Analysis - 100% on RA    Records Reviewed: Nursing Notes    Procedures:  Procedures    Provider Notes (Medical Decision Making): 26yoF to c/o left upper dental pain; has broken tooth x several months. Afeb, no cellulitis. rx for abx and f/u with dental clinic.    MED RECONCILIATION:  No current facility-administered medications for this encounter.      Current Outpatient Medications   Medication Sig   ??? penicillin v potassium (VEETID) 500 mg tablet Take 1 Tab by mouth three (3) times daily for 10 days.   ??? naproxen (NAPROSYN) 500 mg tablet Take 1 Tab by mouth two (2) times daily (with meals) for 10 days.       Disposition:  home    DISCHARGE NOTE:     Pt has been reexamined.  Patient has no new complaints, changes, or physical findings.  Care plan outlined and precautions discussed.   All medications were reviewed with the patient; will d/c home with pcn. All of pt's questions and concerns were addressed. Patient was instructed and agrees to follow up with clinic, as well as to return to the ED upon further deterioration. Patient is ready to go home.    Follow-up Information     Follow up With Specialties Details Why Contact Info    [facility]    [address]  [place] [address]  [phone]    [facility]    [address]  [address]  [place] [address]  [phone]          Current Discharge Medication List      START taking these medications    Details   penicillin v potassium (VEETID) 500 mg tablet Take 1 Tab by mouth three (3) times daily for 10 days.  Qty: 30 Tab, Refills: 0      naproxen (NAPROSYN) 500 mg tablet Take 1 Tab by mouth two (2) times daily (with meals) for 10 days.  Qty: 20 Tab, Refills: 0               Diagnosis     Clinical Impression:   1. Pain, dental

## 2018-02-14 NOTE — ED Notes (Signed)
Patient discharge instructions discussed with patient by Liya, PA

## 2018-02-14 NOTE — ED Triage Notes (Signed)
Pt arrived through triage with c/o dental infection and gum swelling.

## 2018-02-15 ENCOUNTER — Inpatient Hospital Stay
Admit: 2018-02-15 | Discharge: 2018-02-15 | Disposition: A | Discharge status: Home / Self Care | Payer: Self-pay | Attending: Emergency Medicine

## 2018-02-15 MED ORDER — NAPROXEN 500 MG TAB
500 mg | ORAL_TABLET | Freq: Two times a day (BID) | ORAL | 0 refills | Status: AC
Start: 2018-02-15 — End: 2018-02-24

## 2018-02-15 MED ORDER — PENICILLIN V-K 500 MG TAB
500 mg | ORAL_TABLET | Freq: Three times a day (TID) | ORAL | 0 refills | Status: AC
Start: 2018-02-15 — End: 2018-02-24

## 2018-12-25 ENCOUNTER — Other Ambulatory Visit: Payer: Self-pay | Admitting: Obstetrics and Gynecology

## 2018-12-25 ENCOUNTER — Other Ambulatory Visit (HOSPITAL_COMMUNITY)
Admission: RE | Admit: 2018-12-25 | Discharge: 2018-12-25 | Disposition: A | Discharge status: Home / Self Care | Payer: 59 | Source: Ambulatory Visit | Attending: Obstetrics and Gynecology | Admitting: Obstetrics and Gynecology

## 2018-12-25 DIAGNOSIS — Z01419 Encounter for gynecological examination (general) (routine) without abnormal findings: Secondary | ICD-10-CM | POA: Diagnosis not present

## 2018-12-26 LAB — CYTOLOGY - PAP: Diagnosis: NEGATIVE

## 2019-06-18 ENCOUNTER — Other Ambulatory Visit: Payer: Self-pay

## 2019-06-18 DIAGNOSIS — Z20822 Contact with and (suspected) exposure to covid-19: Secondary | ICD-10-CM

## 2019-06-19 LAB — NOVEL CORONAVIRUS, NAA: SARS-CoV-2, NAA: NOT DETECTED

## 2020-01-10 ENCOUNTER — Encounter: Payer: Self-pay | Admitting: Family Medicine

## 2020-01-10 ENCOUNTER — Other Ambulatory Visit: Payer: Self-pay

## 2020-01-10 ENCOUNTER — Ambulatory Visit (INDEPENDENT_AMBULATORY_CARE_PROVIDER_SITE_OTHER): Payer: 59 | Admitting: Family Medicine

## 2020-01-10 DIAGNOSIS — Z8249 Family history of ischemic heart disease and other diseases of the circulatory system: Secondary | ICD-10-CM

## 2020-01-10 DIAGNOSIS — Z131 Encounter for screening for diabetes mellitus: Secondary | ICD-10-CM | POA: Diagnosis not present

## 2020-01-10 DIAGNOSIS — Z13220 Encounter for screening for lipoid disorders: Secondary | ICD-10-CM

## 2020-01-10 DIAGNOSIS — Z136 Encounter for screening for cardiovascular disorders: Secondary | ICD-10-CM

## 2020-01-10 DIAGNOSIS — R635 Abnormal weight gain: Secondary | ICD-10-CM | POA: Diagnosis not present

## 2020-01-10 NOTE — Patient Instructions (Addendum)
 Low Glycemic Foods (20-49)  Breakfast Cereals: All-Bran                All-Bran Fruit ' n Oats Fiber One               Oatmeal (not instant)  Oat bran Fruits and fruit juices: (Limit to 1-2 servings per day) Apples               Apricots (fresh & dried)  Blackberries            Blueberries Cherries                  Cranberries             Peaches                  Pears                       Plums                       Prunes Grapefruit                Raspberries            Strawberries           Tangerine Apple juice             Grapefruit juice Tomato juice  Beans and legumes (fresh-cooked): Black-eyed peas     Butter beans Chick peas              Lentils     Green beans           Lima beans               Kidney beans          Navy beans  Non-starchy vegetables: Asparagus, bok choy, broccoli, cabbage, cauliflower, celery, cucumber, greens, lettuce, mushrooms, peppers, tomatoes, okra, onions, snow peas, spinach, summer squash  Grains: Barley                                Bulgur Rye                                    Wild rice  Nuts and oils : Almonds         Peanuts     Sunflower seeds  Hazelnuts      Pecans          Walnuts Oils that are liquid at room temperature  Dairy, fish, and meat: Milk, skim                         Lowfat cheese Yogurt, lowfat, fruit sugar sweetened Lean red meat                      Fish  Skinless chicken & turkey    Shellfish Moderate Glycemic Foods (50-69)  Breakfast Cereals: Bran Buds                             Bran Chex Just Right                              Mini-Wheats Special K         Swiss muesli  Fruits: Banana (under-ripe)             Dates Figs                                      Grapes Kiwi                                      Mango Oranges                               Raisins  Fruit Juices: Cranberry juice                    Orange juice  Beans and legumes: Boston-type baked beans Canned pinto, kidney,  or navy beans Green peas  Vegetables: Beets                         Raw Carrots  Sweet potato              Yam Corn on the cob  Breads: Pita (pocket) bread          Oat bran bread Pumpernickel bread           Rye bread Wheat bread, high fiber       Grains: Cornmeal                           Rice, brown   Rice, white                         Couscous Pasta: Macaroni                           Pizza  cheese Raviolimeat filled           Spaghetti, white        Nuts: Cashews                           Macadamia  Snacks: Chocolate                    Ice cream,lowfat  Muffin                               Popcorn High Glycemic Foods (70-100)   Breakfast Cereals: Cheerios                 Corn Chex Corn Flakes            Cream of Wheat Grape Nuts              Grape Nut Flakes Life                 Nutri-Grain       Puffed Rice               Puffed Wheat Rice Chex                 Rice Krispies Shredded Wheat               Team Total Fruits: Pineapple                 Watermelon Banana (over-ripe)  Beverages: Sodas, sweet tea, pineapple juice  Vegetables: Potato, baked, boiled, fried, mashed French fries Canned or frozen corn Cooked carrots Parsnips Winter squash  Breads: Most breads (white and whole grain) Bagels                     Bread sticks Bread stuffing          Kaiser roll Dinner rolls  Grains: Rice, instant          Tapioca, with milk  Candy and most cookies Snacks: Donuts                      Corn chips        Jelly beans                 Pretzels Pastries                             Restaurant and ethnic foods Most Chinese food (sugar in stir fry or wok sauces) Teriyaki-style meats and vegetables     If you have lab work done today you will be contacted with your lab results within the next 2 weeks.  If you have not heard from us then please contact us. The fastest way to get your results is to register for My Chart.   IF you received an  x-ray today, you will receive an invoice from [facility]. Please contact [facility] at [phone] with questions or concerns regarding your invoice.   IF you received labwork today, you will receive an invoice from [facility]. Please contact [facility] at [phone] with questions or concerns regarding your invoice.   Our billing staff will not be able to assist you with questions regarding bills from these companies.  You will be contacted with the lab results as soon as they are available. The fastest way to get your results is to activate your My Chart account. Instructions are located on the last page of this paperwork. If you have not heard from us regarding the results in 2 weeks, please contact this office.     

## 2020-01-10 NOTE — Progress Notes (Signed)
 New Patient Office Visit  Subjective:  Patient ID: Joanne Ellis, female    DOB: 04/18/1991  Age: 29 y.o. MRN: [id number]  CC:  Chief Complaint  Patient presents with  . Establish Care  . Weight Loss    and get good overall health.     HPI [name] presents for   Patient reports that she would like to establish with a PCP She would like to talk about weight loss   Obesity Wt Readings from Last 3 Encounters:  01/10/20 225 lb (102.1 kg)  01/15/18 220 lb (99.8 kg)  03/25/15 214 lb (97.1 kg)   She reports that she has a family history of hypertension in her mother. She also has a history of CKD in her grandparents.  She reports that her prepregnancy weight was 170-180s at a high 5'1". She reports that she is trying to eat better.  She starts with a personal trainer next month. She states that she cut back on eating meat and is doing plant based diet and cut back on sugary drinks and is doing more natural juices.  She states that she drinks about 16 ounces of juice a day.   Past Medical History:  Diagnosis Date  . Anxiety    no meds  . Back pain during pregnancy    mid to lower back  . HSV infection     Past Surgical History:  Procedure Laterality Date  . CESAREAN SECTION  05/22/2011   Procedure: CESAREAN SECTION;  Surgeon: Bernard A Marshall, MD;  Location: [facility];  Service: Gynecology;  Laterality: N/A;  . CESAREAN SECTION WITH BILATERAL TUBAL LIGATION Bilateral 05/19/2014   Procedure: REPEAT CESAREAN SECTION WITH BILATERAL TUBAL LIGATION;  Surgeon: Tara J. Cole, MD;  Location: [facility];  Service: Obstetrics;  Laterality: Bilateral;  . DILITATION & CURRETTAGE/HYSTROSCOPY WITH HYDROTHERMAL ABLATION N/A 04/21/2015   Procedure: DILATATION & CURETTAGE/HYSTEROSCOPY WITH HYDROTHERMAL ABLATION;  Surgeon: Tara Cole, MD;  Location: [facility];  Service: Gynecology;  Laterality: N/A;  . TUBAL LIGATION      Family History  Problem Relation Age of Onset  . Asthma Mother   .  Miscarriages / Stillbirths Mother   . Alcohol abuse Maternal Grandmother   . Alcohol abuse Maternal Grandfather   . Alcohol abuse Paternal Grandfather   . Stroke Paternal Grandfather     Social History   Socioeconomic History  . Marital status: Single    Spouse name: Not on file  . Number of children: Not on file  . Years of education: Not on file  . Highest education level: Not on file  Occupational History  . Not on file  Tobacco Use  . Smoking status: Current Every Day Smoker    Packs/day: 0.25    Years: 2.00    Pack years: 0.50    Types: Cigarettes    Last attempt to quit: 09/16/2013    Years since quitting: 6.3  . Smokeless tobacco: Never Used  Substance and Sexual Activity  . Alcohol use: No  . Drug use: No  . Sexual activity: Yes    Birth control/protection: None  Other Topics Concern  . Not on file  Social History Narrative  . Not on file   Social Determinants of Health   Financial Resource Strain:   . Difficulty of Paying Living Expenses:   Food Insecurity:   . Worried About Running Out of Food in the Last Year:   . Ran Out of Food in the Last Year:     Transportation Needs:   . Lack of Transportation (Medical):   . Lack of Transportation (Non-Medical):   Physical Activity:   . Days of Exercise per Week:   . Minutes of Exercise per Session:   Stress:   . Feeling of Stress :   Social Connections:   . Frequency of Communication with Friends and Family:   . Frequency of Social Gatherings with Friends and Family:   . Attends Religious Services:   . Active Member of Clubs or Organizations:   . Attends Club or Organization Meetings:   . Marital Status:   Intimate Partner Violence:   . Fear of Current or Ex-Partner:   . Emotionally Abused:   . Physically Abused:   . Sexually Abused:     ROS Review of Systems   Review of Systems  Constitutional: Negative for activity change, appetite change, chills and fever.  HENT: Negative for congestion,  nosebleeds, trouble swallowing and voice change.   Respiratory: Negative for cough, shortness of breath and wheezing.   Gastrointestinal: Negative for diarrhea, nausea and vomiting.  Genitourinary: Negative for difficulty urinating, dysuria, flank pain and hematuria.  Musculoskeletal: Negative for back pain, joint swelling and neck pain.  Neurological: Negative for dizziness, speech difficulty, light-headedness and numbness.  See HPI. All other review of systems negative.   Objective:   Today's Vitals: BP 119/82   Pulse 82   Temp 97.9 F (36.6 C) (Temporal)   Ht 5' 1" (1.549 m)   Wt 225 lb (102.1 kg)   LMP 12/22/2019   SpO2 97%   BMI 42.51 kg/m   Physical Exam  Physical Exam  Constitutional: Oriented to person, place, and time. Appears well-developed and well-nourished.  HENT:  Head: Normocephalic and atraumatic.  Eyes: Conjunctivae and EOM are normal.  Neck: supple, thyromegaly Cardiovascular: Normal rate, regular rhythm, normal heart sounds and intact distal pulses.  No murmur heard. Pulmonary/Chest: Effort normal and breath sounds normal. No stridor. No respiratory distress. Has no wheezes.  Neurological: Is alert and oriented to person, place, and time.  Skin: Skin is warm. Capillary refill takes less than 2 seconds.  Psychiatric: Has a normal mood and affect. Behavior is normal. Judgment and thought content normal.    Assessment & Plan:   Problem List Items Addressed This Visit    None    Visit Diagnoses    Morbid obesity (HCC)    -  Primary Discussed metabolism, diet and exercise Will screen for underlying disorders   Relevant Orders   Hemoglobin A1c (Completed)   CMP14+EGFR (Completed)   Lipid panel (Completed)   TSH (Completed)   Family history of hypertension in mother    -  Discussed diet to prevent hypertension    Screening for diabetes mellitus       Relevant Orders   Hemoglobin A1c (Completed)   CMP14+EGFR (Completed)   Encounter for lipid  screening for cardiovascular disease    -  Discussed cholesterol goals   Relevant Orders   CMP14+EGFR (Completed)   Lipid panel (Completed)      Outpatient Encounter Medications as of 01/10/2020  Medication Sig  . [DISCONTINUED] ibuprofen (ADVIL,MOTRIN) 100 MG/5ML suspension Take 30 mLs (600 mg total) by mouth every 6 (six) hours. (Patient not taking: Reported on 01/16/2018)  . [DISCONTINUED] ibuprofen (ADVIL,MOTRIN) 200 MG tablet Take 400 mg by mouth every 6 (six) hours as needed for moderate pain.  . [DISCONTINUED] Multiple Vitamin (MULTIVITAMIN WITH MINERALS) TABS tablet Take 1 tablet by mouth daily.  . [DISCONTINUED]   sulfacetamide (BLEPH-10) 10 % ophthalmic solution Place 2 drops into the left eye See admin instructions. Place 2 drops into eye every 2 hours for 2 days and then 4 times a day for 5 days   No facility-administered encounter medications on file as of 01/10/2020.    Follow-up: Return in about 1 week (around 01/17/2020) for virtual visit for lab review and weight management. Pt will follow up with [facility] after that.   Forrest Moron, MD

## 2020-01-11 LAB — CMP14+EGFR
ALT: 10 IU/L (ref 0–32)
AST: 18 IU/L (ref 0–40)
Albumin/Globulin Ratio: 1.1 — ABNORMAL LOW (ref 1.2–2.2)
Albumin: 3.7 g/dL — ABNORMAL LOW (ref 3.9–5.0)
Alkaline Phosphatase: 93 IU/L (ref 39–117)
BUN/Creatinine Ratio: 8 — ABNORMAL LOW (ref 9–23)
BUN: 7 mg/dL (ref 6–20)
Bilirubin Total: 0.5 mg/dL (ref 0.0–1.2)
CO2: 24 mmol/L (ref 20–29)
Calcium: 9.8 mg/dL (ref 8.7–10.2)
Chloride: 104 mmol/L (ref 96–106)
Creatinine, Ser: 0.93 mg/dL (ref 0.57–1.00)
GFR calc Af Amer: 96 mL/min/{1.73_m2} (ref >59)
GFR calc non Af Amer: 83 mL/min/{1.73_m2} (ref >59)
Globulin, Total: 3.3 g/dL (ref 1.5–4.5)
Glucose: 94 mg/dL (ref 65–99)
Potassium: 4.3 mmol/L (ref 3.5–5.2)
Sodium: 142 mmol/L (ref 134–144)
Total Protein: 7 g/dL (ref 6.0–8.5)

## 2020-01-11 LAB — TSH: TSH: 1.63 u[IU]/mL (ref 0.450–4.500)

## 2020-01-11 LAB — LIPID PANEL
Chol/HDL Ratio: 2.8 ratio (ref 0.0–4.4)
Cholesterol, Total: 157 mg/dL (ref 100–199)
HDL: 56 mg/dL (ref >39)
LDL Chol Calc (NIH): 84 mg/dL (ref 0–99)
Triglycerides: 91 mg/dL (ref 0–149)
VLDL Cholesterol Cal: 17 mg/dL (ref 5–40)

## 2020-01-11 LAB — HEMOGLOBIN A1C
Est. average glucose Bld gHb Est-mCnc: 91 mg/dL
Hgb A1c MFr Bld: 4.8 % (ref 4.8–5.6)

## 2020-01-17 ENCOUNTER — Telehealth: Payer: 59 | Admitting: Family Medicine

## 2020-01-24 ENCOUNTER — Other Ambulatory Visit: Payer: Self-pay

## 2020-01-24 ENCOUNTER — Ambulatory Visit (INDEPENDENT_AMBULATORY_CARE_PROVIDER_SITE_OTHER): Payer: 59 | Admitting: Family Medicine

## 2020-01-24 ENCOUNTER — Encounter: Payer: Self-pay | Admitting: Family Medicine

## 2020-01-24 DIAGNOSIS — R635 Abnormal weight gain: Secondary | ICD-10-CM | POA: Diagnosis not present

## 2020-01-24 MED ORDER — METFORMIN HCL 500 MG PO TABS: 500.0000 mg | ORAL_TABLET | Freq: Every day | ORAL | 3 refills | Status: AC

## 2020-01-24 NOTE — Progress Notes (Signed)
Established Patient Office Visit  Subjective:  Patient ID: Joanne Ellis, female    DOB: Sep 23, 1991  Age: 29 y.o. MRN: [id number]  CC:  Chief Complaint  Patient presents with  . Weight Check    f/u   . lab review    HPI Joanne Ellis presents for   Patient states that she is not working out as yet she is waiting on starting with her personal trainer.  She states that she works from [employer] to 2:30am.  She states that she tries to sneak in a walk at work.  She reports that she plans to do Thursday, Friday and Saturday.  She states that she plans to increase her intake of fruits.   Wt Readings from Last 3 Encounters:  01/24/20 224 lb 8 oz (101.8 kg)  01/10/20 225 lb (102.1 kg)  01/15/18 220 lb (99.8 kg)     Past Medical History:  Diagnosis Date  . Anxiety    no meds  . Back pain during pregnancy    mid to lower back  . HSV infection     Past Surgical History:  Procedure Laterality Date  . CESAREAN SECTION  05/22/2011   Procedure: CESAREAN SECTION;  Surgeon: Kathreen Cosier, MD;  Location: [facility];  Service: Gynecology;  Laterality: N/A;  . CESAREAN SECTION WITH BILATERAL TUBAL LIGATION Bilateral 05/19/2014   Procedure: REPEAT CESAREAN SECTION WITH BILATERAL TUBAL LIGATION;  Surgeon: Dorien Chihuahua. Richardson Dopp, MD;  Location: [facility];  Service: Obstetrics;  Laterality: Bilateral;  . DILITATION & CURRETTAGE/HYSTROSCOPY WITH HYDROTHERMAL ABLATION N/A 04/21/2015   Procedure: DILATATION & CURETTAGE/HYSTEROSCOPY WITH HYDROTHERMAL ABLATION;  Surgeon: Gerald Leitz, MD;  Location: [facility];  Service: Gynecology;  Laterality: N/A;  . TUBAL LIGATION      Family History  Problem Relation Age of Onset  . Asthma Mother   . Miscarriages / [place] Mother   . Alcohol abuse Maternal Grandmother   . Alcohol abuse Maternal Grandfather   . Alcohol abuse Paternal Grandfather   . Stroke Paternal Grandfather     Social History   Socioeconomic History  . Marital status: Single    Spouse name: Not on file    . Number of children: Not on file  . Years of education: Not on file  . Highest education level: Not on file  Occupational History  . Not on file  Tobacco Use  . Smoking status: Current Every Day Smoker    Packs/day: 0.25    Years: 2.00    Pack years: 0.50    Types: Cigarettes    Last attempt to quit: 09/16/2013    Years since quitting: 6.3  . Smokeless tobacco: Never Used  Substance and Sexual Activity  . Alcohol use: No  . Drug use: No  . Sexual activity: Yes    Birth control/protection: None  Other Topics Concern  . Not on file  Social History Narrative  . Not on file   Social Determinants of Health   Financial Resource Strain:   . Difficulty of Paying Living Expenses:   Food Insecurity:   . Worried About Programme researcher, broadcasting/film/video in the Last Year:   . Barista in the Last Year:   Transportation Needs:   . Freight forwarder (Medical):   Marland Kitchen Lack of Transportation (Non-Medical):   Physical Activity:   . Days of Exercise per Week:   . Minutes of Exercise per Session:   Stress:   . Feeling of Stress :   Social  Connections:   . Frequency of Communication with Friends and Family:   . Frequency of Social Gatherings with Friends and Family:   . Attends Religious Services:   . Active Member of Clubs or Organizations:   . Attends Banker Meetings:   Marland Kitchen Marital Status:   Intimate Partner Violence:   . Fear of Current or Ex-Partner:   . Emotionally Abused:   Marland Kitchen Physically Abused:   . Sexually Abused:     No outpatient medications prior to visit.   No facility-administered medications prior to visit.    Allergies  Allergen Reactions  . Iodine Swelling  . Latex Itching  . Shellfish Allergy Itching    ROS Review of Systems Review of Systems  Constitutional: Negative for activity change, appetite change, chills and fever.  HENT: Negative for congestion, nosebleeds, trouble swallowing and voice change.   Respiratory: Negative for cough,  shortness of breath and wheezing.   Gastrointestinal: Negative for diarrhea, nausea and vomiting.  Genitourinary: Negative for difficulty urinating, dysuria, flank pain and hematuria.  Musculoskeletal: Negative for back pain, joint swelling and neck pain.  Neurological: Negative for dizziness, speech difficulty, light-headedness and numbness.  See HPI. All other review of systems negative.     Objective:    Physical Exam  BP 119/80   Pulse 84   Temp 98 F (36.7 C) (Temporal)   Ht 5\' 1"  (1.549 m)   Wt 224 lb 8 oz (101.8 kg)   LMP 11/26/2019   SpO2 96%   BMI 42.42 kg/m  Wt Readings from Last 3 Encounters:  01/24/20 224 lb 8 oz (101.8 kg)  01/10/20 225 lb (102.1 kg)  01/15/18 220 lb (99.8 kg)   Physical Exam  Constitutional: Oriented to person, place, and time. Appears well-developed and well-nourished.  HENT:  Head: Normocephalic and atraumatic.  Eyes: Conjunctivae and EOM are normal.  Cardiovascular: Normal rate, regular rhythm, normal heart sounds and intact distal pulses.  No murmur heard. Pulmonary/Chest: Effort normal and breath sounds normal. No stridor. No respiratory distress. Has no wheezes.  Neurological: Is alert and oriented to person, place, and time.  Skin: Skin is warm. Capillary refill takes less than 2 seconds.  Psychiatric: Has a normal mood and affect. Behavior is normal. Judgment and thought content normal.    Health Maintenance Due  Topic Date Due  . COVID-19 Vaccine (1) Never done    There are no preventive care reminders to display for this patient.  Lab Results  Component Value Date   TSH 1.630 01/10/2020   Lab Results  Component Value Date   WBC 7.9 04/21/2015   HGB 11.6 (L) 04/21/2015   HCT 34.9 (L) 04/21/2015   MCV 70.9 (L) 04/21/2015   PLT 256 04/21/2015   Lab Results  Component Value Date   NA 142 01/10/2020   K 4.3 01/10/2020   CO2 24 01/10/2020   GLUCOSE 94 01/10/2020   BUN 7 01/10/2020   CREATININE 0.93 01/10/2020    BILITOT 0.5 01/10/2020   ALKPHOS 93 01/10/2020   AST 18 01/10/2020   ALT 10 01/10/2020   PROT 7.0 01/10/2020   ALBUMIN 3.7 (L) 01/10/2020   CALCIUM 9.8 01/10/2020   Lab Results  Component Value Date   CHOL 157 01/10/2020   Lab Results  Component Value Date   HDL 56 01/10/2020   Lab Results  Component Value Date   LDLCALC 84 01/10/2020   Lab Results  Component Value Date   TRIG 91 01/10/2020  Lab Results  Component Value Date   CHOLHDL 2.8 01/10/2020   Lab Results  Component Value Date   HGBA1C 4.8 01/10/2020      Assessment & Plan:   Problem List Items Addressed This Visit    None    Visit Diagnoses    Morbid obesity ([place])    -  Primary     Discussed metformin for weight loss Discussed exercise at regular intervals for 10 minutes daily in addition to 3 times a week with her personal trainers Advised exercise to improve sleep    No orders of the defined types were placed in this encounter.   Follow-up: No follow-ups on file.    Forrest Moron, MD

## 2020-01-24 NOTE — Patient Instructions (Signed)
° ° ° °  If you have lab work done today you will be contacted with your lab results within the next 2 weeks.  If you have not heard from us then please contact us. The fastest way to get your results is to register for My Chart. ° ° °IF you received an x-ray today, you will receive an invoice from [facility]. Please contact [facility] at [phone] with questions or concerns regarding your invoice.  ° °IF you received labwork today, you will receive an invoice from [facility]. Please contact [facility] at [phone] with questions or concerns regarding your invoice.  ° °Our billing staff will not be able to assist you with questions regarding bills from these companies. ° °You will be contacted with the lab results as soon as they are available. The fastest way to get your results is to activate your My Chart account. Instructions are located on the last page of this paperwork. If you have not heard from us regarding the results in 2 weeks, please contact this office. °  ° ° ° °

## 2020-09-24 ENCOUNTER — Other Ambulatory Visit: Payer: 59
